# Patient Record
Sex: Female | Born: 1945 | Race: White | Hispanic: No | Marital: Married | Smoking: Never smoker
Health system: Southern US, Community
[De-identification: ages and names within clinical notes are randomized; demographics above are authoritative.]

## PROBLEM LIST (undated history)

## (undated) DIAGNOSIS — E119 Type 2 diabetes mellitus without complications: Secondary | ICD-10-CM

## (undated) DIAGNOSIS — R519 Headache, unspecified: Secondary | ICD-10-CM

## (undated) DIAGNOSIS — F329 Major depressive disorder, single episode, unspecified: Secondary | ICD-10-CM

## (undated) DIAGNOSIS — F32A Depression, unspecified: Secondary | ICD-10-CM

## (undated) DIAGNOSIS — Z87442 Personal history of urinary calculi: Secondary | ICD-10-CM

## (undated) DIAGNOSIS — F419 Anxiety disorder, unspecified: Secondary | ICD-10-CM

## (undated) DIAGNOSIS — G4730 Sleep apnea, unspecified: Secondary | ICD-10-CM

## (undated) DIAGNOSIS — I10 Essential (primary) hypertension: Secondary | ICD-10-CM

## (undated) DIAGNOSIS — N189 Chronic kidney disease, unspecified: Secondary | ICD-10-CM

## (undated) DIAGNOSIS — Z9889 Other specified postprocedural states: Secondary | ICD-10-CM

## (undated) DIAGNOSIS — M1990 Unspecified osteoarthritis, unspecified site: Secondary | ICD-10-CM

## (undated) DIAGNOSIS — Z8489 Family history of other specified conditions: Secondary | ICD-10-CM

## (undated) DIAGNOSIS — R112 Nausea with vomiting, unspecified: Secondary | ICD-10-CM

## (undated) DIAGNOSIS — G2581 Restless legs syndrome: Secondary | ICD-10-CM

## (undated) DIAGNOSIS — E785 Hyperlipidemia, unspecified: Secondary | ICD-10-CM

## (undated) DIAGNOSIS — R51 Headache: Secondary | ICD-10-CM

## (undated) DIAGNOSIS — K219 Gastro-esophageal reflux disease without esophagitis: Secondary | ICD-10-CM

## (undated) DIAGNOSIS — G4700 Insomnia, unspecified: Secondary | ICD-10-CM

## (undated) HISTORY — PX: ABDOMINAL HYSTERECTOMY: SHX81

## (undated) HISTORY — PX: BREAST SURGERY: SHX581

## (undated) HISTORY — PX: COLONOSCOPY: SHX174

## (undated) HISTORY — PX: LITHOTRIPSY: SUR834

## (undated) HISTORY — PX: BACK SURGERY: SHX140

## (undated) HISTORY — PX: TONSILLECTOMY: SUR1361

---

## 2005-05-15 ENCOUNTER — Inpatient Hospital Stay (HOSPITAL_COMMUNITY): Admission: RE | Admit: 2005-05-15 | Discharge: 2005-05-16 | Payer: Self-pay | Admitting: Neurosurgery

## 2010-07-18 ENCOUNTER — Encounter: Admission: RE | Admit: 2010-07-18 | Discharge: 2010-07-18 | Payer: Self-pay | Admitting: Neurosurgery

## 2010-08-18 ENCOUNTER — Inpatient Hospital Stay (HOSPITAL_COMMUNITY): Admission: RE | Admit: 2010-08-18 | Discharge: 2010-08-22 | Payer: Self-pay | Admitting: Neurosurgery

## 2010-10-09 ENCOUNTER — Encounter
Admission: RE | Admit: 2010-10-09 | Discharge: 2010-10-09 | Payer: Self-pay | Source: Home / Self Care | Attending: Neurosurgery | Admitting: Neurosurgery

## 2010-11-18 ENCOUNTER — Encounter
Admission: RE | Admit: 2010-11-18 | Discharge: 2010-11-18 | Payer: Self-pay | Source: Home / Self Care | Attending: Neurosurgery | Admitting: Neurosurgery

## 2010-12-31 LAB — CBC
HCT: 39.3 % (ref 36.0–46.0)
Hemoglobin: 13.2 g/dL (ref 12.0–15.0)
MCH: 30.5 pg (ref 26.0–34.0)
MCHC: 33.6 g/dL (ref 30.0–36.0)
MCV: 90.8 fL (ref 78.0–100.0)
RDW: 14.4 % (ref 11.5–15.5)

## 2010-12-31 LAB — BASIC METABOLIC PANEL
BUN: 12 mg/dL (ref 6–23)
CO2: 29 mEq/L (ref 19–32)
Chloride: 103 mEq/L (ref 96–112)
Glucose, Bld: 112 mg/dL — ABNORMAL HIGH (ref 70–99)
Potassium: 4.2 mEq/L (ref 3.5–5.1)
Sodium: 141 mEq/L (ref 135–145)

## 2010-12-31 LAB — ABO/RH: ABO/RH(D): A POS

## 2010-12-31 LAB — TYPE AND SCREEN

## 2010-12-31 LAB — SURGICAL PCR SCREEN: MRSA, PCR: NEGATIVE

## 2011-01-15 ENCOUNTER — Other Ambulatory Visit: Payer: Self-pay | Admitting: Neurosurgery

## 2011-01-15 ENCOUNTER — Ambulatory Visit
Admission: RE | Admit: 2011-01-15 | Discharge: 2011-01-15 | Disposition: A | Discharge status: Home / Self Care | Payer: 59 | Source: Ambulatory Visit | Attending: Neurosurgery | Admitting: Neurosurgery

## 2011-01-15 DIAGNOSIS — M545 Low back pain: Secondary | ICD-10-CM

## 2011-01-15 DIAGNOSIS — M549 Dorsalgia, unspecified: Secondary | ICD-10-CM

## 2011-02-17 ENCOUNTER — Ambulatory Visit
Admission: RE | Admit: 2011-02-17 | Discharge: 2011-02-17 | Disposition: A | Discharge status: Home / Self Care | Payer: 59 | Source: Ambulatory Visit | Attending: Neurosurgery | Admitting: Neurosurgery

## 2011-02-17 DIAGNOSIS — M545 Low back pain: Secondary | ICD-10-CM

## 2011-03-06 NOTE — Op Note (Signed)
Tammy Hicks, Tammy Hicks                  ACCOUNT NO.:  [id number]   MEDICAL RECORD NO.:  [id number]          PATIENT TYPE:  INP   LOCATION:  3013                         FACILITY:  [facility]   PHYSICIAN:  Donalee Citrin, M.D.        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   PROCEDURE:  Decompressive laminectomies L3-4 and L4-5 with microscopic  dissection of the L3-4, and 5 nerve roots.   SURGEON:  Donalee Citrin, M.D.   ASSISTANT:  Tia Alert, MD.   ANESTHESIA:  General endotracheal.   HISTORY OF PRESENT ILLNESS:  The patient is a very pleasant 65 year old  female who has had longstanding back and right leg pain radiating down to  the lateral aspect of her thigh to her knee, anterior shin, as well as big  toe consistent with 4 and 5 root distribution, possible 3.  The patient  failed all forms of conservative treatment and was recommended laminectomy  and decompression of roots.  The risks and benefits of the surgery were  discussed with the patient, she understood, and agreed to proceed forth.   DESCRIPTION OF PROCEDURE:  The patient was brought to the operating room,  was induced under general anesthesia, positioned prone on the Wilson frame.  The back was prepped and draped in the usual sterile fashion.  After a preop  x-ray localized the L4 spinous process, after infiltration of 10 mL of  lidocaine with epinephrine, a midline incision was made and Bovie  electrocautery was used to take down the subcutaneous tissue.  Subperiosteal  dissection was carried out at the lamina of L3,4, and 5 bilaterally.  Intraoperative x-ray confirmed localization of 4-5 disk space and medial  aspect of the facet complex and superior aspect of L3 and L3-4.  __________L4 and  medial aspect of facet complex of L4-5 was drilled down.  Then using a 3-mm Kerrison punch in the inferior aspect of both, the 3 and 4  lamina were removed and the medial facet complex.  The L4 lamina was almost  entirely removed as well as the L3 exposing the ligamentum of flavum which  was noted to be markedly hypertrophied. This was teased away with thecal sac  with 4 Penfield and removed in piecemeal fashion with Kerrison punch.  Then,  the operating microscope was draped and brought onto the field.  Under  microscopic illumination, the severely hypertrophied facet complex which was  causing hourglass compression of the thecal sac was then teased away from  the dura and under bit with a 2 and 3-mm Kerrison punch decompressing the  lateral canal.  The proximal L4  nerve root was identified in 3,4  decompressive site, and the L4 pedicle was identified.  The L4 nerve root  was then tracked inferiorly and appeared to be a conjoined nerve root coming  off  at the level of the disk space in the 4-5 disk space.  This was noted to  be severely stenotic from the lateral facet complex at L4-5 as well as the  superior aspect of that facet.  This was all under bit with a 2-mm Kerrison  punch decompressing the 4 foramen, and a nerve hook, coronary dilator, and  angled hockey stick were passed from the 3-4 decompressive site down to the  4-5 decompressive site, and it was easily visualized and easily passed down  the 3 foramen, 4 foramen, and 5 foramen.  At the end of the decompression,  there was no further stenosis.  The wound was copiously irrigated and  meticulous hemostasis was maintained.  Gelfoam was overlaid on top of the  dura.  The muscle and fascia were reapproximated with 0  interrupted Vicryl, and the skin was closed with running 4-0 subcuticular.  Benzoin and Steri-Strips were applied.  The patient went to recovery in  stable condition.  At the end of the case, needle counts and sponge  counts  were correct.       GC/MEDQ  D:  05/15/2005  T:  05/15/2005  Job:  [id number]

## 2011-08-18 ENCOUNTER — Ambulatory Visit
Admission: RE | Admit: 2011-08-18 | Discharge: 2011-08-18 | Disposition: A | Discharge status: Home / Self Care | Payer: 59 | Source: Ambulatory Visit | Attending: Neurosurgery | Admitting: Neurosurgery

## 2011-08-18 ENCOUNTER — Other Ambulatory Visit: Payer: Self-pay | Admitting: Neurosurgery

## 2011-08-18 DIAGNOSIS — M545 Low back pain, unspecified: Secondary | ICD-10-CM

## 2011-09-01 ENCOUNTER — Ambulatory Visit
Admission: RE | Admit: 2011-09-01 | Discharge: 2011-09-01 | Disposition: A | Discharge status: Home / Self Care | Payer: 59 | Source: Ambulatory Visit | Attending: Neurosurgery | Admitting: Neurosurgery

## 2011-09-01 DIAGNOSIS — M545 Low back pain: Secondary | ICD-10-CM

## 2013-07-31 DIAGNOSIS — G2581 Restless legs syndrome: Secondary | ICD-10-CM | POA: Insufficient documentation

## 2013-07-31 DIAGNOSIS — E785 Hyperlipidemia, unspecified: Secondary | ICD-10-CM | POA: Insufficient documentation

## 2013-11-09 DIAGNOSIS — I10 Essential (primary) hypertension: Secondary | ICD-10-CM | POA: Insufficient documentation

## 2014-01-31 DIAGNOSIS — F411 Generalized anxiety disorder: Secondary | ICD-10-CM | POA: Insufficient documentation

## 2014-02-21 DIAGNOSIS — H8110 Benign paroxysmal vertigo, unspecified ear: Secondary | ICD-10-CM | POA: Insufficient documentation

## 2014-02-21 DIAGNOSIS — I8393 Asymptomatic varicose veins of bilateral lower extremities: Secondary | ICD-10-CM | POA: Insufficient documentation

## 2014-04-20 DIAGNOSIS — E119 Type 2 diabetes mellitus without complications: Secondary | ICD-10-CM | POA: Insufficient documentation

## 2014-05-28 DIAGNOSIS — K219 Gastro-esophageal reflux disease without esophagitis: Secondary | ICD-10-CM | POA: Insufficient documentation

## 2014-07-19 DIAGNOSIS — R319 Hematuria, unspecified: Secondary | ICD-10-CM | POA: Insufficient documentation

## 2014-07-19 DIAGNOSIS — M48061 Spinal stenosis, lumbar region without neurogenic claudication: Secondary | ICD-10-CM | POA: Insufficient documentation

## 2014-10-30 DIAGNOSIS — M542 Cervicalgia: Secondary | ICD-10-CM | POA: Insufficient documentation

## 2014-12-11 DIAGNOSIS — M533 Sacrococcygeal disorders, not elsewhere classified: Secondary | ICD-10-CM | POA: Insufficient documentation

## 2015-08-05 DIAGNOSIS — M5416 Radiculopathy, lumbar region: Secondary | ICD-10-CM | POA: Insufficient documentation

## 2015-08-22 DIAGNOSIS — M5459 Other low back pain: Secondary | ICD-10-CM | POA: Insufficient documentation

## 2015-09-03 DIAGNOSIS — F330 Major depressive disorder, recurrent, mild: Secondary | ICD-10-CM | POA: Insufficient documentation

## 2015-10-16 DIAGNOSIS — Z9181 History of falling: Secondary | ICD-10-CM | POA: Insufficient documentation

## 2015-12-04 DIAGNOSIS — E669 Obesity, unspecified: Secondary | ICD-10-CM | POA: Insufficient documentation

## 2016-03-10 DIAGNOSIS — M461 Sacroiliitis, not elsewhere classified: Secondary | ICD-10-CM | POA: Insufficient documentation

## 2016-05-25 DIAGNOSIS — F451 Undifferentiated somatoform disorder: Secondary | ICD-10-CM | POA: Insufficient documentation

## 2016-05-29 DIAGNOSIS — M5136 Other intervertebral disc degeneration, lumbar region: Secondary | ICD-10-CM | POA: Insufficient documentation

## 2016-05-29 DIAGNOSIS — M51369 Other intervertebral disc degeneration, lumbar region without mention of lumbar back pain or lower extremity pain: Secondary | ICD-10-CM | POA: Insufficient documentation

## 2017-01-29 DIAGNOSIS — M7989 Other specified soft tissue disorders: Secondary | ICD-10-CM | POA: Insufficient documentation

## 2017-03-20 DIAGNOSIS — I872 Venous insufficiency (chronic) (peripheral): Secondary | ICD-10-CM | POA: Insufficient documentation

## 2017-05-18 DIAGNOSIS — I83891 Varicose veins of right lower extremities with other complications: Secondary | ICD-10-CM | POA: Insufficient documentation

## 2017-05-31 ENCOUNTER — Other Ambulatory Visit: Payer: Self-pay | Admitting: Neurosurgery

## 2017-06-11 NOTE — Pre-Procedure Instructions (Addendum)
[facility] [place]  06/11/2017      [facility] [address], [place] - [address] [facility] [place] [address] Phone: [phone] Fax: [phone]    Your procedure is scheduled on Thursday September 6.  Report to [facility] Admitting at 7:45 A.M.  Call this number if you have problems the morning of surgery:  [phone]   Remember:  Do not eat food or drink liquids after midnight.  Continue all medications as prescribed, except follow the instructions below:   Take these medicines the morning of surgery with A SIP OF WATER: clorazepate (Tranxene), desvenlafazine (Pristiq), estradiol (estrace), gabapentin (neurontin), meclizine (antivert) if needed, vilazodone (Viibryd)  7 days prior to surgery STOP taking any Aspirin, FIORICET, Aleve, Naproxen, Ibuprofen, Motrin, Advil, Goody's, BC's, all herbal medications, fish oil, and all vitamins   WHAT DO I DO ABOUT MY DIABETES MEDICATION?   Do not take oral diabetes medicines (pills) the morning of surgery. DO NOT TAKE glipizide (glucotrol), sitagliptin (Januvia) the day of surgery  TAKE ONLY MORNING and/or lunch doses of Glipizide (glucotrol) the day before surgery    How to Manage Your Diabetes Before and After Surgery  Why is it important to control my blood sugar before and after surgery? . Improving blood sugar levels before and after surgery helps healing and can limit problems. . A way of improving blood sugar control is eating a healthy diet by: o  Eating less sugar and carbohydrates o  Increasing activity/exercise o  Talking with your doctor about reaching your blood sugar goals . High blood sugars (greater than 180 mg/dL) can raise your risk of infections and slow your recovery, so you will need to focus on controlling your diabetes during the weeks before surgery. . Make sure that the doctor who takes care of your diabetes knows about your planned surgery including  the date and location.  How do I manage my blood sugar before surgery? . Check your blood sugar at least 4 times a day, starting 2 days before surgery, to make sure that the level is not too high or low. o Check your blood sugar the morning of your surgery when you wake up and every 2 hours until you get to the Short Stay unit. . If your blood sugar is less than 70 mg/dL, you will need to treat for low blood sugar: o Do not take insulin. o Treat a low blood sugar (less than 70 mg/dL) with  cup of clear juice (cranberry or apple), 4 glucose tablets, OR glucose gel. o Recheck blood sugar in 15 minutes after treatment (to make sure it is greater than 70 mg/dL). If your blood sugar is not greater than 70 mg/dL on recheck, call [phone] for further instructions. . Report your blood sugar to the short stay nurse when you get to Short Stay.  . If you are admitted to the hospital after surgery: o Your blood sugar will be checked by the staff and you will probably be given insulin after surgery (instead of oral diabetes medicines) to make sure you have good blood sugar levels. o The goal for blood sugar control after surgery is 80-180 mg/dL.              Do not wear jewelry, make-up or nail polish.  Do not wear lotions, powders, or perfumes, or deoderant.  Do not shave 48 hours prior to surgery.  Men may shave face and neck.  Do not bring  valuables to the hospital.  [facility] is not responsible for any belongings or valuables.  Contacts, dentures or bridgework may not be worn into surgery.  Leave your suitcase in the car.  After surgery it may be brought to your room.  For patients admitted to the hospital, discharge time will be determined by your treatment team.  Patients discharged the day of surgery will not be allowed to drive home.  Special instructions:    [facility]- Preparing For Surgery  Before surgery, you can play an important role. Because skin is not sterile,  your skin needs to be as free of germs as possible. You can reduce the number of germs on your skin by washing with CHG (chlorahexidine gluconate) Soap before surgery.  CHG is an antiseptic cleaner which kills germs and bonds with the skin to continue killing germs even after washing.  Please do not use if you have an allergy to CHG or antibacterial soaps. If your skin becomes reddened/irritated stop using the CHG.  Do not shave (including legs and underarms) for at least 48 hours prior to first CHG shower. It is OK to shave your face.  Please follow these instructions carefully.   1. Shower the NIGHT BEFORE SURGERY and the MORNING OF SURGERY with CHG.   2. If you chose to wash your hair, wash your hair first as usual with your normal shampoo.  3. After you shampoo, rinse your hair and body thoroughly to remove the shampoo.  4. Use CHG as you would any other liquid soap. You can apply CHG directly to the skin and wash gently with a scrungie or a clean washcloth.   5. Apply the CHG Soap to your body ONLY FROM THE NECK DOWN.  Do not use on open wounds or open sores. Avoid contact with your eyes, ears, mouth and genitals (private parts). Wash genitals (private parts) with your normal soap.  6. Wash thoroughly, paying special attention to the area where your surgery will be performed.  7. Thoroughly rinse your body with warm water from the neck down.  8. DO NOT shower/wash with your normal soap after using and rinsing off the CHG Soap.  9. Pat yourself dry with a CLEAN TOWEL.   10. Wear CLEAN PAJAMAS   11. Place CLEAN SHEETS on your bed the night of your first shower and DO NOT SLEEP WITH PETS.    Day of Surgery: Do not apply any deodorants/lotions. Please wear clean clothes to the hospital/surgery center.      Please read over the following fact sheets that you were given. MRSA Information

## 2017-06-14 ENCOUNTER — Encounter (HOSPITAL_COMMUNITY): Payer: Self-pay

## 2017-06-14 ENCOUNTER — Encounter (HOSPITAL_COMMUNITY)
Admission: RE | Admit: 2017-06-14 | Discharge: 2017-06-14 | Disposition: A | Discharge status: Home / Self Care | Payer: Medicare Other | Source: Ambulatory Visit | Attending: Neurosurgery | Admitting: Neurosurgery

## 2017-06-14 DIAGNOSIS — M5440 Lumbago with sciatica, unspecified side: Secondary | ICD-10-CM | POA: Insufficient documentation

## 2017-06-14 DIAGNOSIS — Z01812 Encounter for preprocedural laboratory examination: Secondary | ICD-10-CM | POA: Insufficient documentation

## 2017-06-14 DIAGNOSIS — Z0183 Encounter for blood typing: Secondary | ICD-10-CM | POA: Insufficient documentation

## 2017-06-14 DIAGNOSIS — I491 Atrial premature depolarization: Secondary | ICD-10-CM | POA: Insufficient documentation

## 2017-06-14 DIAGNOSIS — Z01818 Encounter for other preprocedural examination: Secondary | ICD-10-CM | POA: Diagnosis not present

## 2017-06-14 HISTORY — DX: Restless legs syndrome: G25.81

## 2017-06-14 HISTORY — DX: Headache: R51

## 2017-06-14 HISTORY — DX: Type 2 diabetes mellitus without complications: E11.9

## 2017-06-14 HISTORY — DX: Personal history of urinary calculi: Z87.442

## 2017-06-14 HISTORY — DX: Hyperlipidemia, unspecified: E78.5

## 2017-06-14 HISTORY — DX: Family history of other specified conditions: Z84.89

## 2017-06-14 HISTORY — DX: Sleep apnea, unspecified: G47.30

## 2017-06-14 HISTORY — DX: Gastro-esophageal reflux disease without esophagitis: K21.9

## 2017-06-14 HISTORY — DX: Headache, unspecified: R51.9

## 2017-06-14 HISTORY — DX: Insomnia, unspecified: G47.00

## 2017-06-14 HISTORY — DX: Anxiety disorder, unspecified: F41.9

## 2017-06-14 HISTORY — DX: Essential (primary) hypertension: I10

## 2017-06-14 HISTORY — DX: Depression, unspecified: F32.A

## 2017-06-14 HISTORY — DX: Major depressive disorder, single episode, unspecified: F32.9

## 2017-06-14 HISTORY — DX: Unspecified osteoarthritis, unspecified site: M19.90

## 2017-06-14 LAB — CBC
HEMATOCRIT: 45.2 % (ref 36.0–46.0)
HEMOGLOBIN: 14.7 g/dL (ref 12.0–15.0)
MCH: 29.4 pg (ref 26.0–34.0)
MCHC: 32.5 g/dL (ref 30.0–36.0)
MCV: 90.4 fL (ref 78.0–100.0)
Platelets: 291 10*3/uL (ref 150–400)
RBC: 5 MIL/uL (ref 3.87–5.11)
RDW: 14 % (ref 11.5–15.5)
WBC: 11.9 10*3/uL — ABNORMAL HIGH (ref 4.0–10.5)

## 2017-06-14 LAB — BASIC METABOLIC PANEL
ANION GAP: 12 (ref 5–15)
BUN: 10 mg/dL (ref 6–20)
CHLORIDE: 104 mmol/L (ref 101–111)
CO2: 26 mmol/L (ref 22–32)
Calcium: 9.9 mg/dL (ref 8.9–10.3)
Creatinine, Ser: 0.73 mg/dL (ref 0.44–1.00)
GFR calc Af Amer: >60 mL/min (ref >60)
GLUCOSE: 147 mg/dL — AB (ref 65–99)
POTASSIUM: 3.4 mmol/L — AB (ref 3.5–5.1)
Sodium: 142 mmol/L (ref 135–145)

## 2017-06-14 LAB — TYPE AND SCREEN
ABO/RH(D): A POS
Antibody Screen: NEGATIVE

## 2017-06-14 LAB — GLUCOSE, CAPILLARY: Glucose-Capillary: 158 mg/dL — ABNORMAL HIGH (ref 65–99)

## 2017-06-14 LAB — HEMOGLOBIN A1C
Hgb A1c MFr Bld: 6.3 % — ABNORMAL HIGH (ref 4.8–5.6)
MEAN PLASMA GLUCOSE: 134.11 mg/dL

## 2017-06-14 LAB — SURGICAL PCR SCREEN
MRSA, PCR: NEGATIVE
Staphylococcus aureus: NEGATIVE

## 2017-06-14 MED ORDER — CHLORHEXIDINE GLUCONATE CLOTH 2 % EX PADS: 6.0000 | MEDICATED_PAD | Freq: Once | CUTANEOUS | Status: DC

## 2017-06-14 NOTE — Progress Notes (Signed)
PCP - Doreatha Martin Cardiologist - none  EKG - 06/14/2017  Stress Test - pt reports normal stress test over 10 years ago, unsure who performed this test Sleep Study - "10 years ago" with Dr. Jerolyn Shin, records requested CPAP - uses CPAP, does not know pressure settings  Fasting Blood Sugar - pt reports having CBG of 68 this AM when she woke up, patient ate bowl of cereal, CBG 158 at PAT appointment. Pt educated to let PCP know if CBG consistently low in the AM as she takes Glipizide at night.   Pt reports that she was started on a new BP medicine (Benicar), and ever since she was taking this medicine she has been forgetful. Pt states she is going to call her PCP after appointment today to notify of this issue with her BP medicine.   Patient denies shortness of breath, fever, cough and chest pain at PAT appointment  Patient verbalized understanding of instructions that were given to them at the PAT appointment. Patient was also instructed that they will need to review over the PAT instructions again at home before surgery.

## 2017-06-23 ENCOUNTER — Encounter (HOSPITAL_COMMUNITY): Payer: Self-pay | Admitting: General Practice

## 2017-06-23 MED ORDER — CEFAZOLIN SODIUM-DEXTROSE 2-4 GM/100ML-% IV SOLN
2.0000 g | INTRAVENOUS | Status: AC
  Administered 2017-06-24: 2 g via INTRAVENOUS
  Filled 2017-06-23: qty 100

## 2017-06-24 ENCOUNTER — Inpatient Hospital Stay (HOSPITAL_COMMUNITY)
Admission: RE | Admit: 2017-06-24 | Discharge: 2017-06-25 | DRG: 455 | Disposition: A | Discharge status: Home / Self Care | Payer: Medicare Other | Source: Ambulatory Visit | Attending: Neurosurgery | Admitting: Neurosurgery

## 2017-06-24 ENCOUNTER — Inpatient Hospital Stay (HOSPITAL_COMMUNITY): Payer: Medicare Other | Admitting: Certified Registered Nurse Anesthetist

## 2017-06-24 ENCOUNTER — Inpatient Hospital Stay (HOSPITAL_COMMUNITY): Payer: Medicare Other

## 2017-06-24 ENCOUNTER — Encounter (HOSPITAL_COMMUNITY): Payer: Self-pay | Admitting: Certified Registered Nurse Anesthetist

## 2017-06-24 ENCOUNTER — Encounter (HOSPITAL_COMMUNITY)
Admission: RE | Disposition: A | Discharge status: Home / Self Care | Payer: Self-pay | Source: Ambulatory Visit | Attending: Neurosurgery

## 2017-06-24 DIAGNOSIS — Z7982 Long term (current) use of aspirin: Secondary | ICD-10-CM

## 2017-06-24 DIAGNOSIS — G43909 Migraine, unspecified, not intractable, without status migrainosus: Secondary | ICD-10-CM | POA: Diagnosis present

## 2017-06-24 DIAGNOSIS — Z791 Long term (current) use of non-steroidal anti-inflammatories (NSAID): Secondary | ICD-10-CM | POA: Diagnosis not present

## 2017-06-24 DIAGNOSIS — R402142 Coma scale, eyes open, spontaneous, at arrival to emergency department: Secondary | ICD-10-CM | POA: Diagnosis present

## 2017-06-24 DIAGNOSIS — I10 Essential (primary) hypertension: Secondary | ICD-10-CM | POA: Diagnosis present

## 2017-06-24 DIAGNOSIS — Z7951 Long term (current) use of inhaled steroids: Secondary | ICD-10-CM

## 2017-06-24 DIAGNOSIS — G4700 Insomnia, unspecified: Secondary | ICD-10-CM | POA: Diagnosis present

## 2017-06-24 DIAGNOSIS — R402362 Coma scale, best motor response, obeys commands, at arrival to emergency department: Secondary | ICD-10-CM | POA: Diagnosis present

## 2017-06-24 DIAGNOSIS — K219 Gastro-esophageal reflux disease without esophagitis: Secondary | ICD-10-CM | POA: Diagnosis present

## 2017-06-24 DIAGNOSIS — E785 Hyperlipidemia, unspecified: Secondary | ICD-10-CM | POA: Diagnosis present

## 2017-06-24 DIAGNOSIS — M48061 Spinal stenosis, lumbar region without neurogenic claudication: Principal | ICD-10-CM | POA: Diagnosis present

## 2017-06-24 DIAGNOSIS — M549 Dorsalgia, unspecified: Secondary | ICD-10-CM | POA: Diagnosis present

## 2017-06-24 DIAGNOSIS — Z90710 Acquired absence of both cervix and uterus: Secondary | ICD-10-CM | POA: Diagnosis not present

## 2017-06-24 DIAGNOSIS — G2581 Restless legs syndrome: Secondary | ICD-10-CM | POA: Diagnosis present

## 2017-06-24 DIAGNOSIS — Z888 Allergy status to other drugs, medicaments and biological substances status: Secondary | ICD-10-CM | POA: Diagnosis not present

## 2017-06-24 DIAGNOSIS — R402252 Coma scale, best verbal response, oriented, at arrival to emergency department: Secondary | ICD-10-CM | POA: Diagnosis present

## 2017-06-24 DIAGNOSIS — M1990 Unspecified osteoarthritis, unspecified site: Secondary | ICD-10-CM | POA: Diagnosis present

## 2017-06-24 DIAGNOSIS — E119 Type 2 diabetes mellitus without complications: Secondary | ICD-10-CM | POA: Diagnosis present

## 2017-06-24 DIAGNOSIS — Z419 Encounter for procedure for purposes other than remedying health state, unspecified: Secondary | ICD-10-CM

## 2017-06-24 DIAGNOSIS — G4730 Sleep apnea, unspecified: Secondary | ICD-10-CM | POA: Diagnosis present

## 2017-06-24 DIAGNOSIS — Z885 Allergy status to narcotic agent status: Secondary | ICD-10-CM | POA: Diagnosis not present

## 2017-06-24 HISTORY — DX: Other specified postprocedural states: Z98.890

## 2017-06-24 HISTORY — DX: Nausea with vomiting, unspecified: R11.2

## 2017-06-24 LAB — GLUCOSE, CAPILLARY
GLUCOSE-CAPILLARY: 107 mg/dL — AB (ref 65–99)
GLUCOSE-CAPILLARY: 181 mg/dL — AB (ref 65–99)
GLUCOSE-CAPILLARY: 254 mg/dL — AB (ref 65–99)
GLUCOSE-CAPILLARY: 263 mg/dL — AB (ref 65–99)
Glucose-Capillary: 107 mg/dL — ABNORMAL HIGH (ref 65–99)

## 2017-06-24 SURGERY — POSTERIOR LUMBAR FUSION 1 LEVEL
Anesthesia: General | Site: Spine Lumbar

## 2017-06-24 MED ORDER — PROMETHAZINE HCL 25 MG/ML IJ SOLN: 6.2500 mg | INTRAMUSCULAR | Status: DC | PRN

## 2017-06-24 MED ORDER — LACTATED RINGERS IV SOLN
INTRAVENOUS | Status: DC
  Administered 2017-06-24 (×3): via INTRAVENOUS

## 2017-06-24 MED ORDER — HYDROMORPHONE HCL 1 MG/ML IJ SOLN
INTRAMUSCULAR | Status: AC
  Administered 2017-06-24: 0.5 mg via INTRAVENOUS
  Filled 2017-06-24: qty 1

## 2017-06-24 MED ORDER — ACETAMINOPHEN 650 MG RE SUPP
650.0000 mg | RECTAL | Status: DC | PRN
Start: 2017-06-24 — End: 2017-06-25

## 2017-06-24 MED ORDER — BUTALBITAL-APAP-CAFFEINE 50-325-40 MG PO TABS: 1.0000 | ORAL_TABLET | ORAL | Status: DC | PRN

## 2017-06-24 MED ORDER — LIDOCAINE-EPINEPHRINE 1 %-1:100000 IJ SOLN
INTRAMUSCULAR | Status: AC
  Filled 2017-06-24: qty 1

## 2017-06-24 MED ORDER — PANTOPRAZOLE SODIUM 40 MG PO TBEC: 40.0000 mg | DELAYED_RELEASE_TABLET | Freq: Every day | ORAL | Status: DC

## 2017-06-24 MED ORDER — 0.9 % SODIUM CHLORIDE (POUR BTL) OPTIME
TOPICAL | Status: DC | PRN
  Administered 2017-06-24: 1000 mL

## 2017-06-24 MED ORDER — HYDROMORPHONE HCL 1 MG/ML IJ SOLN
0.2500 mg | INTRAMUSCULAR | Status: DC | PRN
Start: 2017-06-24 — End: 2017-06-24
  Administered 2017-06-24 (×4): 0.5 mg via INTRAVENOUS

## 2017-06-24 MED ORDER — SODIUM CHLORIDE 0.9% FLUSH: 3.0000 mL | INTRAVENOUS | Status: DC | PRN

## 2017-06-24 MED ORDER — POTASSIUM CHLORIDE CRYS ER 20 MEQ PO TBCR
20.0000 meq | EXTENDED_RELEASE_TABLET | Freq: Two times a day (BID) | ORAL | Status: DC
  Administered 2017-06-24 – 2017-06-25 (×2): 20 meq via ORAL
  Filled 2017-06-24 (×2): qty 1

## 2017-06-24 MED ORDER — PANTOPRAZOLE SODIUM 40 MG IV SOLR
40.0000 mg | Freq: Every day | INTRAVENOUS | Status: DC
  Administered 2017-06-24: 40 mg via INTRAVENOUS
  Filled 2017-06-24: qty 40

## 2017-06-24 MED ORDER — SURGIFOAM 100 EX MISC
CUTANEOUS | Status: DC | PRN
  Administered 2017-06-24 (×2): 20 mL via TOPICAL

## 2017-06-24 MED ORDER — CYCLOBENZAPRINE HCL 10 MG PO TABS
10.0000 mg | ORAL_TABLET | Freq: Three times a day (TID) | ORAL | Status: DC | PRN
  Administered 2017-06-24 – 2017-06-25 (×2): 10 mg via ORAL
  Filled 2017-06-24 (×2): qty 1

## 2017-06-24 MED ORDER — ONDANSETRON HCL 4 MG PO TABS: 4.0000 mg | ORAL_TABLET | Freq: Four times a day (QID) | ORAL | Status: DC | PRN

## 2017-06-24 MED ORDER — BUPIVACAINE LIPOSOME 1.3 % IJ SUSP
INTRAMUSCULAR | Status: DC | PRN
  Administered 2017-06-24: 20 mL

## 2017-06-24 MED ORDER — SODIUM CHLORIDE 0.9 % IV SOLN: 250.0000 mL | INTRAVENOUS | Status: DC

## 2017-06-24 MED ORDER — FUROSEMIDE 40 MG PO TABS: 40.0000 mg | ORAL_TABLET | Freq: Every day | ORAL | Status: DC | PRN

## 2017-06-24 MED ORDER — POLYVINYL ALCOHOL 1.4 % OP SOLN
1.0000 [drp] | Freq: Every evening | OPHTHALMIC | Status: DC | PRN
  Filled 2017-06-24: qty 15

## 2017-06-24 MED ORDER — MEPERIDINE HCL 25 MG/ML IJ SOLN: 6.2500 mg | INTRAMUSCULAR | Status: DC | PRN

## 2017-06-24 MED ORDER — LIDOCAINE HCL (CARDIAC) 20 MG/ML IV SOLN
INTRAVENOUS | Status: DC | PRN
  Administered 2017-06-24: 20 mg via INTRAVENOUS

## 2017-06-24 MED ORDER — SODIUM CHLORIDE 0.9 % IR SOLN
Status: DC | PRN
  Administered 2017-06-24: 500 mL

## 2017-06-24 MED ORDER — VANCOMYCIN HCL 1000 MG IV SOLR
INTRAVENOUS | Status: AC
  Filled 2017-06-24: qty 1000

## 2017-06-24 MED ORDER — LIDOCAINE-EPINEPHRINE 1 %-1:100000 IJ SOLN
INTRAMUSCULAR | Status: DC | PRN
  Administered 2017-06-24: 9 mL

## 2017-06-24 MED ORDER — SCOPOLAMINE 1 MG/3DAYS TD PT72
MEDICATED_PATCH | TRANSDERMAL | Status: AC
  Administered 2017-06-24: 1.5 mg
  Filled 2017-06-24: qty 1

## 2017-06-24 MED ORDER — ALUM & MAG HYDROXIDE-SIMETH 200-200-20 MG/5ML PO SUSP: 30.0000 mL | Freq: Four times a day (QID) | ORAL | Status: DC | PRN

## 2017-06-24 MED ORDER — PRAVASTATIN SODIUM 10 MG PO TABS
10.0000 mg | ORAL_TABLET | Freq: Every day | ORAL | Status: DC
  Filled 2017-06-24 (×2): qty 1

## 2017-06-24 MED ORDER — FENTANYL CITRATE (PF) 100 MCG/2ML IJ SOLN
INTRAMUSCULAR | Status: DC | PRN
  Administered 2017-06-24: 50 ug via INTRAVENOUS
  Administered 2017-06-24: 100 ug via INTRAVENOUS
  Administered 2017-06-24: 50 ug via INTRAVENOUS
  Administered 2017-06-24: 150 ug via INTRAVENOUS
  Administered 2017-06-24: 100 ug via INTRAVENOUS

## 2017-06-24 MED ORDER — FENTANYL CITRATE (PF) 250 MCG/5ML IJ SOLN
INTRAMUSCULAR | Status: AC
  Filled 2017-06-24: qty 5

## 2017-06-24 MED ORDER — VANCOMYCIN HCL 1000 MG IV SOLR
INTRAVENOUS | Status: DC | PRN
  Administered 2017-06-24: 1000 mg via TOPICAL

## 2017-06-24 MED ORDER — MIDAZOLAM HCL 2 MG/2ML IJ SOLN: 0.5000 mg | Freq: Once | INTRAMUSCULAR | Status: DC | PRN

## 2017-06-24 MED ORDER — VENLAFAXINE HCL ER 37.5 MG PO CP24
37.5000 mg | ORAL_CAPSULE | Freq: Every day | ORAL | Status: DC
  Administered 2017-06-25: 37.5 mg via ORAL
  Filled 2017-06-24: qty 1

## 2017-06-24 MED ORDER — GLIPIZIDE 5 MG PO TABS
5.0000 mg | ORAL_TABLET | Freq: Two times a day (BID) | ORAL | Status: DC
  Administered 2017-06-24 – 2017-06-25 (×2): 5 mg via ORAL
  Filled 2017-06-24 (×4): qty 1

## 2017-06-24 MED ORDER — VILAZODONE HCL 20 MG PO TABS
20.0000 mg | ORAL_TABLET | Freq: Every day | ORAL | Status: DC
  Administered 2017-06-25: 20 mg via ORAL
  Filled 2017-06-24 (×2): qty 1

## 2017-06-24 MED ORDER — THROMBIN 20000 UNITS EX SOLR
CUTANEOUS | Status: AC
  Filled 2017-06-24: qty 20000

## 2017-06-24 MED ORDER — FENTANYL CITRATE (PF) 250 MCG/5ML IJ SOLN
INTRAMUSCULAR | Status: AC
Start: 2017-06-24 — End: ?
  Filled 2017-06-24: qty 5

## 2017-06-24 MED ORDER — SODIUM CHLORIDE 0.9% FLUSH
3.0000 mL | Freq: Two times a day (BID) | INTRAVENOUS | Status: DC
  Administered 2017-06-25: 3 mL via INTRAVENOUS

## 2017-06-24 MED ORDER — ONDANSETRON HCL 4 MG/2ML IJ SOLN
INTRAMUSCULAR | Status: DC | PRN
  Administered 2017-06-24 (×2): 4 mg via INTRAVENOUS

## 2017-06-24 MED ORDER — OXYCODONE-ACETAMINOPHEN 5-325 MG PO TABS
1.0000 | ORAL_TABLET | ORAL | Status: DC | PRN
  Administered 2017-06-24 – 2017-06-25 (×2): 1 via ORAL
  Filled 2017-06-24 (×2): qty 1

## 2017-06-24 MED ORDER — HYDROMORPHONE HCL 1 MG/ML IJ SOLN
0.5000 mg | INTRAMUSCULAR | Status: DC | PRN
  Administered 2017-06-24: 0.5 mg via INTRAVENOUS
  Filled 2017-06-24: qty 0.5

## 2017-06-24 MED ORDER — ZOLPIDEM TARTRATE 5 MG PO TABS: 5.0000 mg | ORAL_TABLET | Freq: Once | ORAL | Status: DC

## 2017-06-24 MED ORDER — OXYCODONE-ACETAMINOPHEN 10-325 MG PO TABS: 1.0000 | ORAL_TABLET | ORAL | Status: DC | PRN

## 2017-06-24 MED ORDER — MECLIZINE HCL 12.5 MG PO TABS: 25.0000 mg | ORAL_TABLET | Freq: Four times a day (QID) | ORAL | Status: DC | PRN

## 2017-06-24 MED ORDER — CEFAZOLIN SODIUM-DEXTROSE 2-4 GM/100ML-% IV SOLN
INTRAVENOUS | Status: AC
  Filled 2017-06-24: qty 100

## 2017-06-24 MED ORDER — SUGAMMADEX SODIUM 200 MG/2ML IV SOLN
INTRAVENOUS | Status: DC | PRN
  Administered 2017-06-24: 200 mg via INTRAVENOUS

## 2017-06-24 MED ORDER — OLMESARTAN MEDOXOMIL-HCTZ 40-25 MG PO TABS: 1.0000 | ORAL_TABLET | Freq: Every day | ORAL | Status: DC

## 2017-06-24 MED ORDER — FLUTICASONE PROPIONATE 50 MCG/ACT NA SUSP: 1.0000 | Freq: Every evening | NASAL | Status: DC | PRN

## 2017-06-24 MED ORDER — OXYCODONE HCL 5 MG PO TABS
5.0000 mg | ORAL_TABLET | ORAL | Status: DC | PRN
  Administered 2017-06-24 – 2017-06-25 (×2): 5 mg via ORAL
  Filled 2017-06-24 (×2): qty 1

## 2017-06-24 MED ORDER — PROPOFOL 10 MG/ML IV BOLUS
INTRAVENOUS | Status: AC
  Filled 2017-06-24: qty 20

## 2017-06-24 MED ORDER — CEFAZOLIN SODIUM-DEXTROSE 2-4 GM/100ML-% IV SOLN
2.0000 g | Freq: Three times a day (TID) | INTRAVENOUS | Status: DC
  Administered 2017-06-24 – 2017-06-25 (×3): 2 g via INTRAVENOUS
  Filled 2017-06-24 (×3): qty 100

## 2017-06-24 MED ORDER — MENTHOL 3 MG MT LOZG: 1.0000 | LOZENGE | OROMUCOSAL | Status: DC | PRN

## 2017-06-24 MED ORDER — PRAMIPEXOLE DIHYDROCHLORIDE 1 MG PO TABS
1.0000 mg | ORAL_TABLET | Freq: Every day | ORAL | Status: DC
  Administered 2017-06-24: 1 mg via ORAL
  Filled 2017-06-24: qty 1

## 2017-06-24 MED ORDER — PROPOFOL 10 MG/ML IV BOLUS
INTRAVENOUS | Status: DC | PRN
  Administered 2017-06-24: 150 mg via INTRAVENOUS

## 2017-06-24 MED ORDER — ACETAMINOPHEN 325 MG PO TABS: 650.0000 mg | ORAL_TABLET | ORAL | Status: DC | PRN

## 2017-06-24 MED ORDER — PHENOL 1.4 % MT LIQD: 1.0000 | OROMUCOSAL | Status: DC | PRN

## 2017-06-24 MED ORDER — IRBESARTAN 300 MG PO TABS
300.0000 mg | ORAL_TABLET | Freq: Every day | ORAL | Status: DC
  Administered 2017-06-24 – 2017-06-25 (×2): 300 mg via ORAL
  Filled 2017-06-24 (×3): qty 1

## 2017-06-24 MED ORDER — THROMBIN 5000 UNITS EX SOLR
CUTANEOUS | Status: AC
  Filled 2017-06-24: qty 5000

## 2017-06-24 MED ORDER — ASENAPINE MALEATE 5 MG SL SUBL
5.0000 mg | SUBLINGUAL_TABLET | Freq: Every day | SUBLINGUAL | Status: DC
  Administered 2017-06-24: 5 mg via SUBLINGUAL
  Filled 2017-06-24 (×2): qty 1

## 2017-06-24 MED ORDER — SCOPOLAMINE 1 MG/3DAYS TD PT72: 1.0000 | MEDICATED_PATCH | Freq: Once | TRANSDERMAL | Status: DC

## 2017-06-24 MED ORDER — DEXAMETHASONE SODIUM PHOSPHATE 10 MG/ML IJ SOLN
INTRAMUSCULAR | Status: DC | PRN
  Administered 2017-06-24: 10 mg via INTRAVENOUS

## 2017-06-24 MED ORDER — ESTRADIOL 1 MG PO TABS
1.0000 mg | ORAL_TABLET | Freq: Every day | ORAL | Status: DC
  Administered 2017-06-25: 1 mg via ORAL
  Filled 2017-06-24 (×2): qty 1

## 2017-06-24 MED ORDER — ONDANSETRON HCL 4 MG/2ML IJ SOLN: 4.0000 mg | Freq: Four times a day (QID) | INTRAMUSCULAR | Status: DC | PRN

## 2017-06-24 MED ORDER — ROCURONIUM BROMIDE 100 MG/10ML IV SOLN
INTRAVENOUS | Status: DC | PRN
  Administered 2017-06-24: 10 mg via INTRAVENOUS
  Administered 2017-06-24: 50 mg via INTRAVENOUS
  Administered 2017-06-24: 10 mg via INTRAVENOUS

## 2017-06-24 MED ORDER — HYDROCHLOROTHIAZIDE 25 MG PO TABS
25.0000 mg | ORAL_TABLET | Freq: Every day | ORAL | Status: DC
  Administered 2017-06-24 – 2017-06-25 (×2): 25 mg via ORAL
  Filled 2017-06-24 (×2): qty 1

## 2017-06-24 MED ORDER — BUPIVACAINE LIPOSOME 1.3 % IJ SUSP
20.0000 mL | INTRAMUSCULAR | Status: DC
  Filled 2017-06-24: qty 20

## 2017-06-24 MED ORDER — GABAPENTIN 100 MG PO CAPS
100.0000 mg | ORAL_CAPSULE | Freq: Three times a day (TID) | ORAL | Status: DC
  Administered 2017-06-24 – 2017-06-25 (×3): 100 mg via ORAL
  Filled 2017-06-24 (×3): qty 1

## 2017-06-24 MED ORDER — CLORAZEPATE DIPOTASSIUM 3.75 MG PO TABS
7.5000 mg | ORAL_TABLET | Freq: Two times a day (BID) | ORAL | Status: DC
  Administered 2017-06-24 – 2017-06-25 (×2): 7.5 mg via ORAL
  Filled 2017-06-24 (×2): qty 2

## 2017-06-24 SURGICAL SUPPLY — 78 items
ADH SKN CLS APL DERMABOND .7 (GAUZE/BANDAGES/DRESSINGS) ×1
ALLOFUSE SELECT 5CC (Bone Implant) ×2 IMPLANT
ALLOFUSE SELECT CM 5CC (Bone Implant) ×1 IMPLANT
APL SKNCLS STERI-STRIP NONHPOA (GAUZE/BANDAGES/DRESSINGS) ×1
BAG DECANTER FOR FLEXI CONT (MISCELLANEOUS) ×3 IMPLANT
BENZOIN TINCTURE PRP APPL 2/3 (GAUZE/BANDAGES/DRESSINGS) ×3 IMPLANT
BLADE CLIPPER SURG (BLADE) IMPLANT
BLADE SURG 11 STRL SS (BLADE) ×3 IMPLANT
BUR CUTTER 7.0 ROUND (BURR) ×3 IMPLANT
BUR MATCHSTICK NEURO 3.0 LAGG (BURR) ×3 IMPLANT
CANISTER SUCT 3000ML PPV (MISCELLANEOUS) ×3 IMPLANT
CAP LOCKING THREADED (Cap) ×12 IMPLANT
CARTRIDGE OIL MAESTRO DRILL (MISCELLANEOUS) ×1 IMPLANT
CLOSURE WOUND 1/2 X4 (GAUZE/BANDAGES/DRESSINGS) ×1
CONT SPEC 4OZ CLIKSEAL STRL BL (MISCELLANEOUS) ×3 IMPLANT
COVER BACK TABLE 60X90IN (DRAPES) ×3 IMPLANT
DECANTER SPIKE VIAL GLASS SM (MISCELLANEOUS) ×3 IMPLANT
DERMABOND ADVANCED (GAUZE/BANDAGES/DRESSINGS) ×2
DERMABOND ADVANCED .7 DNX12 (GAUZE/BANDAGES/DRESSINGS) ×1 IMPLANT
DIFFUSER DRILL AIR PNEUMATIC (MISCELLANEOUS) ×3 IMPLANT
DRAPE C-ARM 42X72 X-RAY (DRAPES) ×6 IMPLANT
DRAPE C-ARMOR (DRAPES) IMPLANT
DRAPE HALF SHEET 40X57 (DRAPES) ×6 IMPLANT
DRAPE LAPAROTOMY 100X72X124 (DRAPES) ×3 IMPLANT
DRAPE POUCH INSTRU U-SHP 10X18 (DRAPES) ×3 IMPLANT
DRAPE SURG 17X23 STRL (DRAPES) ×3 IMPLANT
DRSG OPSITE 4X5.5 SM (GAUZE/BANDAGES/DRESSINGS) ×3 IMPLANT
DRSG OPSITE POSTOP 4X6 (GAUZE/BANDAGES/DRESSINGS) ×3 IMPLANT
DURAPREP 26ML APPLICATOR (WOUND CARE) ×3 IMPLANT
ELECT REM PT RETURN 9FT ADLT (ELECTROSURGICAL) ×3
ELECTRODE REM PT RTRN 9FT ADLT (ELECTROSURGICAL) ×1 IMPLANT
EVACUATOR 3/16  PVC DRAIN (DRAIN) ×2
EVACUATOR 3/16 PVC DRAIN (DRAIN) ×1 IMPLANT
GAUZE SPONGE 4X4 12PLY STRL (GAUZE/BANDAGES/DRESSINGS) ×3 IMPLANT
GAUZE SPONGE 4X4 16PLY XRAY LF (GAUZE/BANDAGES/DRESSINGS) IMPLANT
GLOVE BIO SURGEON STRL SZ7 (GLOVE) ×3 IMPLANT
GLOVE BIO SURGEON STRL SZ8 (GLOVE) ×6 IMPLANT
GLOVE BIOGEL PI IND STRL 7.0 (GLOVE) ×1 IMPLANT
GLOVE BIOGEL PI IND STRL 7.5 (GLOVE) ×4 IMPLANT
GLOVE BIOGEL PI INDICATOR 7.0 (GLOVE) ×2
GLOVE BIOGEL PI INDICATOR 7.5 (GLOVE) ×8
GLOVE ECLIPSE 7.0 STRL STRAW (GLOVE) ×3 IMPLANT
GLOVE ECLIPSE 7.5 STRL STRAW (GLOVE) IMPLANT
GLOVE EXAM NITRILE LRG STRL (GLOVE) IMPLANT
GLOVE EXAM NITRILE XL STR (GLOVE) IMPLANT
GLOVE EXAM NITRILE XS STR PU (GLOVE) IMPLANT
GLOVE INDICATOR 8.5 STRL (GLOVE) ×6 IMPLANT
GLOVE SURG SS PI 7.0 STRL IVOR (GLOVE) ×12 IMPLANT
GOWN STRL REUS W/ TWL LRG LVL3 (GOWN DISPOSABLE) ×1 IMPLANT
GOWN STRL REUS W/ TWL XL LVL3 (GOWN DISPOSABLE) ×2 IMPLANT
GOWN STRL REUS W/TWL 2XL LVL3 (GOWN DISPOSABLE) ×3 IMPLANT
GOWN STRL REUS W/TWL LRG LVL3 (GOWN DISPOSABLE) ×3
GOWN STRL REUS W/TWL XL LVL3 (GOWN DISPOSABLE) ×6
HEMOSTAT POWDER KIT SURGIFOAM (HEMOSTASIS) IMPLANT
KIT BASIN OR (CUSTOM PROCEDURE TRAY) ×3 IMPLANT
KIT INFUSE XX SMALL 0.7CC (Orthopedic Implant) ×3 IMPLANT
KIT ROOM TURNOVER OR (KITS) ×3 IMPLANT
NEEDLE HYPO 25X1 1.5 SAFETY (NEEDLE) ×3 IMPLANT
NS IRRIG 1000ML POUR BTL (IV SOLUTION) ×3 IMPLANT
OIL CARTRIDGE MAESTRO DRILL (MISCELLANEOUS) ×3
PACK LAMINECTOMY NEURO (CUSTOM PROCEDURE TRAY) ×3 IMPLANT
PAD ARMBOARD 7.5X6 YLW CONV (MISCELLANEOUS) ×15 IMPLANT
ROD CREO 45MM SPINAL (Rod) ×6 IMPLANT
SCREW CREO 6.5X40 (Screw) ×12 IMPLANT
SCREW PA THRD CREO TULIP 5.5X4 (Head) ×12 IMPLANT
SPACER RISE 8X22 8-14MM-10 (Neuro Prosthesis/Implant) ×6 IMPLANT
SPONGE LAP 4X18 X RAY DECT (DISPOSABLE) IMPLANT
SPONGE SURGIFOAM ABS GEL 100 (HEMOSTASIS) ×6 IMPLANT
STRIP CLOSURE SKIN 1/2X4 (GAUZE/BANDAGES/DRESSINGS) ×2 IMPLANT
SUT VIC AB 0 CT1 18XCR BRD8 (SUTURE) ×2 IMPLANT
SUT VIC AB 0 CT1 8-18 (SUTURE) ×6
SUT VIC AB 2-0 CT1 18 (SUTURE) ×3 IMPLANT
SUT VIC AB 4-0 PS2 27 (SUTURE) ×3 IMPLANT
TISSUE ALLOFUSE SELECT 5CC (Bone Implant) ×1 IMPLANT
TOWEL GREEN STERILE (TOWEL DISPOSABLE) ×3 IMPLANT
TOWEL GREEN STERILE FF (TOWEL DISPOSABLE) ×3 IMPLANT
TRAY FOLEY W/METER SILVER 16FR (SET/KITS/TRAYS/PACK) ×3 IMPLANT
WATER STERILE IRR 1000ML POUR (IV SOLUTION) ×3 IMPLANT

## 2017-06-24 NOTE — Anesthesia Postprocedure Evaluation (Signed)
Anesthesia Post Note  Patient: Tammy Hicks  Procedure(s) Performed: Procedure(s) (LRB): Lumbar Two-Three Posterior lumbar interbody fusion (N/A)     Patient location during evaluation: PACU Anesthesia Type: General Level of consciousness: awake and alert, patient cooperative and oriented Pain management: pain level controlled Vital Signs Assessment: post-procedure vital signs reviewed and stable Respiratory status: spontaneous breathing, nonlabored ventilation, respiratory function stable and patient connected to nasal cannula oxygen Cardiovascular status: blood pressure returned to baseline and stable Postop Assessment: no signs of nausea or vomiting Anesthetic complications: no    Last Vitals:  Vitals:   06/24/17 1520 06/24/17 1554  BP: 126/72 136/76  Pulse: 92 93  Resp: 11 18  Temp: 36.7 C 36.6 C  SpO2: 99% 96%    Last Pain:  Vitals:   06/24/17 1520  TempSrc:   PainSc: 3                  Francetta Ilg,E. Mauriana Dann

## 2017-06-24 NOTE — Evaluation (Signed)
Physical Therapy Evaluation Patient Details Name: Tammy Hicks MRN: [id number] DOB: 01/24/1946 Today's Date: 06/24/2017   History of Present Illness  Pt is a 71 y/o female s/p L2-3 PLIF. PMH includes anxiety, depression, DM, HTN, restless leg syndrome, and OSA on CPAP.   Clinical Impression  Patient is s/p above surgery resulting in the deficits listed below (see PT Problem List). PTA, pt was independent with mobility. Upon eval, pt limited by post op pain and weakness, as well as, slightly decreased balance. Very guarded gait and required HHA and IV pole, with min guard for gait training. Pt reports husband will assist as needed upon d/c home. Patient will benefit from skilled PT to increase their independence and safety with mobility (while adhering to their precautions) to allow discharge to the venue listed below.     Follow Up Recommendations No PT follow up;Supervision for mobility/OOB    Equipment Recommendations  None recommended by PT (has all necessary DME)    Recommendations for Other Services       Precautions / Restrictions Precautions Precautions: Back Precaution Booklet Issued: Yes (comment) Precaution Comments: Reviewed back precautions with pt.  Required Braces or Orthoses: Spinal Brace Spinal Brace: Lumbar corset;Applied in sitting position Restrictions Weight Bearing Restrictions: No      Mobility  Bed Mobility Overal bed mobility: Needs Assistance Bed Mobility: Rolling;Sidelying to Sit;Sit to Sidelying Rolling: Min guard Sidelying to sit: Min guard     Sit to sidelying: Min guard General bed mobility comments: Min guard to ensure appropriate log roll technique.   Transfers Overall transfer level: Needs assistance Equipment used: 1 person hand held assist Transfers: Sit to/from Stand Sit to Stand: Min assist         General transfer comment: Min A for lift assist. Verbal cues to power through BLE.   Ambulation/Gait Ambulation/Gait assistance: Min  guard Ambulation Distance (Feet): 50 Feet Assistive device: 1 person hand held assist (IV pole ) Gait Pattern/deviations: Step-through pattern;Decreased stride length Gait velocity: Decreased Gait velocity interpretation: Below normal speed for age/gender General Gait Details: Slow, very guarded gait. Use of IV pole and HHA for steadying.   Stairs            Wheelchair Mobility    Modified Rankin (Stroke Patients Only)       Balance Overall balance assessment: Needs assistance Sitting-balance support: No upper extremity supported;Feet supported Sitting balance-Leahy Scale: Good Sitting balance - Comments: Reviewed application of brace in sitting    Standing balance support: Bilateral upper extremity supported;During functional activity Standing balance-Leahy Scale: Poor Standing balance comment: Reliant on BUE support for balance                              Pertinent Vitals/Pain Pain Assessment: 0-10 Pain Score: 5  Pain Location: back  Pain Descriptors / Indicators: Aching;Operative site guarding;Sore Pain Intervention(s): Limited activity within patient's tolerance;Monitored during session;Repositioned    Home Living Family/patient expects to be discharged to:: Private residence Living Arrangements: Spouse/significant other;Children;Other relatives Available Help at Discharge: Family;Available 24 hours/day Type of Home: House Home Access: Stairs to enter Entrance Stairs-Rails: None Entrance Stairs-Number of Steps: 3 Home Layout: One level Home Equipment: Walker - 4 wheels;Cane - single point;Bedside commode      Prior Function Level of Independence: Independent               Hand Dominance   Dominant Hand: Right    Extremity/Trunk Assessment  Upper Extremity Assessment Upper Extremity Assessment: Defer to OT evaluation    Lower Extremity Assessment Lower Extremity Assessment: Generalized weakness;LLE deficits/detail LLE Deficits  / Details: Pt reporting no pain in LLE like she had at baseline. Functional weakness noted     Cervical / Trunk Assessment Cervical / Trunk Assessment: Other exceptions Cervical / Trunk Exceptions: s/p PLIF   Communication   Communication: No difficulties  Cognition Arousal/Alertness: Awake/alert Behavior During Therapy: WFL for tasks assessed/performed Overall Cognitive Status: Within Functional Limits for tasks assessed                                        General Comments General comments (skin integrity, edema, etc.): Pt's husband present during session. Educated about generalized walking program to perform at home     Exercises     Assessment/Plan    PT Assessment Patient needs continued PT services  PT Problem List Decreased strength;Decreased range of motion;Decreased balance;Decreased knowledge of precautions;Pain       PT Treatment Interventions Gait training;Stair training;Therapeutic activities;Functional mobility training;Therapeutic exercise;Balance training;Neuromuscular re-education;Patient/family education;DME instruction    PT Goals (Current goals can be found in the Care Plan section)  Acute Rehab PT Goals Patient Stated Goal: to go home  PT Goal Formulation: With patient Time For Goal Achievement: 07/01/17 Potential to Achieve Goals: Good    Frequency Min 5X/week   Barriers to discharge        Co-evaluation               AM-PAC PT "6 Clicks" Daily Activity  Outcome Measure Difficulty turning over in bed (including adjusting bedclothes, sheets and blankets)?: Unable Difficulty moving from lying on back to sitting on the side of the bed? : Unable Difficulty sitting down on and standing up from a chair with arms (e.g., wheelchair, bedside commode, etc,.)?: Unable Help needed moving to and from a bed to chair (including a wheelchair)?: A Little Help needed walking in hospital room?: A Little Help needed climbing 3-5 steps with  a railing? : A Little 6 Click Score: 12    End of Session Equipment Utilized During Treatment: Gait belt;Back brace Activity Tolerance: Patient tolerated treatment well Patient left: in bed;with call bell/phone within reach;with family/visitor present Nurse Communication: Mobility status PT Visit Diagnosis: Other abnormalities of gait and mobility (R26.89);Pain Pain - part of body:  (back)    Time: 4098-1191 PT Time Calculation (min) (ACUTE ONLY): 20 min   Charges:   PT Evaluation $PT Eval Low Complexity: 1 Low PT Treatments $Gait Training: 8-22 mins   PT G Codes:        Gladys Damme, PT, DPT  Acute Rehabilitation Services  Pager: [phone]   Tammy Hicks 06/24/2017, 6:17 PM

## 2017-06-24 NOTE — Transfer of Care (Signed)
Immediate Anesthesia Transfer of Care Note  Patient: Tammy Hicks  Procedure(s) Performed: Procedure(s): Lumbar Two-Three Posterior lumbar interbody fusion (N/A)  Patient Location: PACU  Anesthesia Type:General  Level of Consciousness: awake, alert , oriented and patient cooperative  Airway & Oxygen Therapy: Patient Spontanous Breathing and Patient connected to nasal cannula oxygen  Post-op Assessment: Report given to RN and Post -op Vital signs reviewed and stable  Post vital signs: Reviewed and stable  Last Vitals:  Vitals:   06/24/17 0748  BP: (!) 147/83  Pulse: 71  Resp: 20  Temp: 36.8 C  SpO2: 97%    Last Pain:  Vitals:   06/24/17 0821  TempSrc:   PainSc: 10-Worst pain ever         Complications: No apparent anesthesia complications

## 2017-06-24 NOTE — H&P (Signed)
Tammy Hicks is an 71 y.o. female.   Chief Complaint: Back and bilateral leg pain with neurogenic claudication HPI: 71 year old female with long-standing back pain as previously undergone anterior lateral interbody fusions at L3-4 and L4-5 presents with progressive worsening back pain and difficulty walking pain in both her quads workup revealed severe stenosis and segmental breakdown above her fusion at L2-3. Due to patient's failed conservative treatment imaging findings and progression of clinical syndrome I recommended decompression stabilization procedure at L2-3. I've extensively gone over the risks and benefits of the operation with the patient as well as perioperative course expectations of outcome and alternatives of surgery and she understood and agreed to proceed forward.  Past Medical History:  Diagnosis Date  . Anxiety   . Depression   . Diabetes mellitus without complication (HCC)   . Family history of adverse reaction to anesthesia    "my sister gets really sick afterwards"  . GERD (gastroesophageal reflux disease)   . Headache    migraines  . History of kidney stones   . Hyperlipidemia   . Hypertension   . Insomnia   . Osteoarthritis   . PONV (postoperative nausea and vomiting)    "progresses to migraine"  . Restless leg syndrome   . Sleep apnea    uses CPAP    Past Surgical History:  Procedure Laterality Date  . ABDOMINAL HYSTERECTOMY    . BACK SURGERY     x2   . CESAREAN SECTION    . COLONOSCOPY     x2  . LITHOTRIPSY     "several"  . TONSILLECTOMY      History reviewed. No pertinent family history. Social History:  reports that she has never smoked. She has never used smokeless tobacco. She reports that she does not drink alcohol or use drugs.  Allergies:  Allergies  Allergen Reactions  . Primidone Palpitations and Shortness Of Breath    DYSPNEA  . Actos [Pioglitazone] Swelling and Other (See Comments)    Leg swelling Fatigue  . Codeine Nausea Only     Severe nausea   . Metformin And Related Swelling    Medications Prior to Admission  Medication Sig Dispense Refill  . asenapine (SAPHRIS) 5 MG SUBL 24 hr tablet Place 5 mg under the tongue at bedtime.    . clorazepate (TRANXENE) 7.5 MG tablet Take 7.5 mg by mouth 2 (two) times daily.    Marland Kitchen desvenlafaxine (PRISTIQ) 50 MG 24 hr tablet Take 50 mg by mouth daily.    Marland Kitchen estradiol (ESTRACE) 1 MG tablet Take 1 mg by mouth daily.    Marland Kitchen gabapentin (NEURONTIN) 100 MG capsule Take 100 mg by mouth 3 (three) times daily.    Marland Kitchen glipiZIDE (GLUCOTROL) 5 MG tablet Take 5 mg by mouth 2 (two) times daily before a meal.    . meloxicam (MOBIC) 15 MG tablet Take 25 mg by mouth every 6 (six) hours as needed for pain.    Marland Kitchen olmesartan-hydrochlorothiazide (BENICAR HCT) 40-25 MG tablet Take 1 tablet by mouth daily.    Marland Kitchen omeprazole (PRILOSEC) 20 MG capsule Take 20 mg by mouth daily.    Marland Kitchen oxyCODONE-acetaminophen (PERCOCET) 10-325 MG tablet Take 1 tablet by mouth every 4 (four) hours as needed for pain.    Bertram Gala Glycol-Propyl Glycol (SYSTANE OP) Place 1 drop into both eyes at bedtime as needed (dry eyes).    . potassium chloride SA (K-DUR,KLOR-CON) 20 MEQ tablet Take 20 mEq by mouth 2 (two) times daily.    Marland Kitchen  pramipexole (MIRAPEX) 1 MG tablet Take 1 mg by mouth at bedtime.    . pravastatin (PRAVACHOL) 10 MG tablet Take 10 mg by mouth daily.    . sitaGLIPtin (JANUVIA) 100 MG tablet Take 100 mg by mouth daily.    . Vilazodone HCl (VIIBRYD) 20 MG TABS Take 20 mg by mouth daily.    . butalbital-acetaminophen-caffeine (FIORICET, ESGIC) 50-325-40 MG tablet Take 1 tablet by mouth every 4 (four) hours as needed for migraine.    . fluticasone (FLONASE) 50 MCG/ACT nasal spray Place 1 spray into both nostrils at bedtime as needed for allergies or rhinitis.    . furosemide (LASIX) 40 MG tablet Take 40 mg by mouth daily as needed for fluid.    Marland Kitchen. meclizine (ANTIVERT) 25 MG tablet Take 25 mg by mouth every 6 (six) hours as needed  for dizziness.    Marland Kitchen. zolpidem (AMBIEN CR) 12.5 MG CR tablet Take 12.5 mg by mouth at bedtime as needed for sleep.      Results for orders placed or performed during the hospital encounter of 06/24/17 (from the past 48 hour(s))  Glucose, capillary     Status: Abnormal   Collection Time: 06/24/17  7:53 AM  Result Value Ref Range   Glucose-Capillary 107 (H) 65 - 99 mg/dL   No results found.  Review of Systems  Musculoskeletal: Positive for back pain and myalgias.  Neurological: Positive for tingling and sensory change.    Blood pressure (!) 147/83, pulse 71, temperature 98.2 F (36.8 C), temperature source Oral, resp. rate 20, SpO2 97 %. Physical Exam  Constitutional: She appears well-developed and well-nourished.  HENT:  Head: Normocephalic.  Eyes: Pupils are equal, round, and reactive to light.  Neck: Normal range of motion.  Respiratory: Effort normal.  GI: Soft. Bowel sounds are normal.  Neurological: She has normal strength. GCS eye subscore is 4. GCS verbal subscore is 5. GCS motor subscore is 6.  Strength is 4+ out of 5 iliopsoas bilaterally quads also for placenta 5 distally 5 out of 5 both lower extremities  Skin: Skin is warm and dry.     Assessment/Plan 71 year old female presents for an L2-3 decompression fusion  Tammy Hicks P, MD 06/24/2017, 9:20 AM

## 2017-06-24 NOTE — Op Note (Signed)
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   postoperative diagnosis: Same  Procedure: Decompressive lumbar laminectomy L2-3 with complete medial facetectomies radical foraminotomies in excess and requiring more work than would be needed with a standard interbody fusion  #2 posterior lumbar interbody fusion utilizing the globus rise expandable cage system packed with locally harvested autograft mixed with allostem and BMP  #3 pedicle screw fixation L2-3 utilizing the globus Creole modular pedicle screw set  #4 posterior lateral arthrodesis L2-3 utilizing locally harvested autograft mixed with allsotem and BMP  Surgeon: Jillyn HiddenGary Lachina Salsberry  Asst.: Dr. Rolly PancakeNeelsh Nundkumar Graf anesthesia Gen.  EBL minimal amount   history of present illness : 71 year old female with long-standing back pain and previous undergone L3-L5 fusion initially did very well however last weeks and months is a progress worsening back pain and bilateral leg pain workup revealed severe segmental degeneration above her previous fusion at L2-3. Due to her failure conservative treatment imaging findings and progression of clinical syndrome I recommended decompression stabilization procedure at L2-3. I extensively went over the risks and benefits of the operation with her as well as perioperative course expectations of outcome and alternatives of surgery and she understood and agreed to proceed forward.  Operative procedure: Patient brought into the or was induced on general anesthesia positioned prone the Wilson frame her back was prepped and draped in routine sterile fashion an incision was drawn out just superior to her old incision and the scar tissues dissected free and subperiosteal dissections care lamina of L2 and L3 confirmed by interoperative x-ray. The spinous processes at L2 was then removed central decompression was begun with a high-speed drill to 20  minute Kerrison punch. Complete facetectomies were performed there was marked hourglass compression of thecal sac at the level to 3 disc space this was all unroofed and removed decompressing the central canal as well as decompressing the L2 and L3 nerve roots out the entirety of the foramina. Aggressive undercutting of the superior tickling facet to gain access the lateral margins disc space as well. Epidural veins currently disc space was incised and cleanout bilaterally using sequential sequential distraction with a 9 distractor in place I selected 8-14 expandable 10 lordotic cages packed them with the autograft mix and inserted both cages and inserted local autograft mix centrally between the cages prior to insertion of the second cage. All this was done with fluoroscopy and all back confirmed good position. Then attention second pedicle screw placement using a high-speed drill pilot holes were drilled pedicles were cannulated probed tapped with a 55 Probed again and 6 5 x 40 screws inserted bilaterally at L2 and L3. All screws excellent purchase. Wounds and to see her graft seems this was maintained aggressive decortication was care MTPs or lateral gutters then the remainder the autograft mix was packed posterior laterally along with BMP and then the rods were assembled the nuts were anchored place and tightened down all the foraminal reinspected confirm patency no migration of graft material. Gelfoam was overlaid top of the dura and the wound was angling vancomycin powder Hemovac drain was placed Exparel was injected in the fascia . The wounds and closed in layers after placement of a large Hemovac drain with interrupted Vicryl running 4 septic or and skin Dermabond benzo and Steri-Strips and sterile dressings applied patient recovered in stable condition. At the end of case all needle counts sponge counts were correct.

## 2017-06-24 NOTE — Anesthesia Preprocedure Evaluation (Signed)
Anesthesia Evaluation  Patient identified by MRN, date of birth, ID band Patient awake    Reviewed: Allergy & Precautions, NPO status , Patient's Chart, lab work & pertinent test results  History of Anesthesia Complications (+) PONV  Airway Mallampati: II  TM Distance: >3 FB     Dental  (+) Dental Advisory Given   Pulmonary sleep apnea and Continuous Positive Airway Pressure Ventilation ,    breath sounds clear to auscultation       Cardiovascular hypertension, Pt. on medications (-) angina Rhythm:Regular Rate:Normal     Neuro/Psych  Headaches, Anxiety Depression Chronic back pain    GI/Hepatic Neg liver ROS, GERD  Medicated and Controlled,  Endo/Other  diabetes (glu 107), Oral Hypoglycemic AgentsMorbid obesity  Renal/GU negative Renal ROS     Musculoskeletal  (+) Arthritis ,   Abdominal (+) + obese,   Peds  Hematology negative hematology ROS (+)   Anesthesia Other Findings   Reproductive/Obstetrics                             Anesthesia Physical Anesthesia Plan  ASA: III  Anesthesia Plan: General   Post-op Pain Management:    Induction: Intravenous  PONV Risk Score and Plan: 4 or greater and Ondansetron, Dexamethasone, Midazolam, Scopolamine patch - Pre-op and Propofol infusion  Airway Management Planned: Oral ETT  Additional Equipment:   Intra-op Plan:   Post-operative Plan: Extubation in OR  Informed Consent: I have reviewed the patients History and Physical, chart, labs and discussed the procedure including the risks, benefits and alternatives for the proposed anesthesia with the patient or authorized representative who has indicated his/her understanding and acceptance.   Dental advisory given  Plan Discussed with: CRNA and Surgeon  Anesthesia Plan Comments: (Plan routine monitors, GETA )        Anesthesia Quick Evaluation

## 2017-06-25 LAB — GLUCOSE, CAPILLARY: Glucose-Capillary: 141 mg/dL — ABNORMAL HIGH (ref 65–99)

## 2017-06-25 MED ORDER — OXYCODONE-ACETAMINOPHEN 5-325 MG PO TABS: 1.0000 | ORAL_TABLET | ORAL | 0 refills | Status: DC | PRN

## 2017-06-25 MED ORDER — CYCLOBENZAPRINE HCL 10 MG PO TABS: 10.0000 mg | ORAL_TABLET | Freq: Three times a day (TID) | ORAL | 0 refills | Status: DC | PRN

## 2017-06-25 MED FILL — Gelatin Absorbable Sponge Size 100: CUTANEOUS | Qty: 1 | Status: AC

## 2017-06-25 MED FILL — Thrombin For Soln 20000 Unit: CUTANEOUS | Qty: 1 | Status: AC

## 2017-06-25 NOTE — Discharge Summary (Signed)
Physician Discharge Summary  Patient ID: Tammy Hicks MRN: [id number] DOB/AGE: 70/06/1946 70 y.o.  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 The patient tolerated the procedure well and was taken to the recovery room and then to the floor in stable condition. The hospital course was routine. There were no complications. The wound remained clean dry and intact. Pt had appropriate back soreness. No complaints of leg pain or new N/T/W. The patient remained afebrile with stable vital signs, and tolerated a regular diet. The patient continued to increase activities, and pain was well controlled with oral pain medications.   Consults: None  Significant Diagnostic Studies:  Results for orders placed or performed during the hospital encounter of 06/24/17  Glucose, capillary  Result Value Ref Range   Glucose-Capillary 107 (H) 65 - 99 mg/dL  Glucose, capillary  Result Value Ref Range   Glucose-Capillary 107 (H) 65 - 99 mg/dL   Comment 1 Notify RN    Comment 2 Document in Chart   Glucose, capillary  Result Value Ref Range   Glucose-Capillary 181 (H) 65 - 99 mg/dL  Glucose, capillary  Result Value Ref Range   Glucose-Capillary 254 (H) 65 - 99 mg/dL  Glucose, capillary  Result Value Ref Range   Glucose-Capillary 263 (H) 65 - 99 mg/dL   Comment 1 Notify RN    Comment 2 Document in Chart   Glucose, capillary  Result Value Ref Range   Glucose-Capillary 141 (H) 65 - 99 mg/dL    Dg Lumbar Spine 2-3 Views  Result Date: 06/24/2017 CLINICAL DATA:  L2-3 lumbar fusion EXAM: DG C-ARM 61-120 MIN; LUMBAR SPINE - 2-3 VIEW COMPARISON:  05/31/2017 FINDINGS:  Surgically placed pedicle screws at L2 and L3 bilaterally in good position. Bilateral metal spacers in the L2-3 disc space in good position Interbody fusion at L3-4 L4-5 with spinous process fusion at L3-4 and L4-5 unchanged from the preop study IMPRESSION: Pedicle screw and interbody fusion L2-3 in good position. Electronically Signed   By: Marlan Palauharles  Clark M.D.   On: 06/24/2017 14:31   Dg C-arm 1-60 Min  Result Date: 06/24/2017 CLINICAL DATA:  L2-3 lumbar fusion EXAM: DG C-ARM 61-120 MIN; LUMBAR SPINE - 2-3 VIEW COMPARISON:  05/31/2017 FINDINGS: Surgically placed pedicle screws at L2 and L3 bilaterally in good position. Bilateral metal spacers in the L2-3 disc space in good position Interbody fusion at L3-4 L4-5 with spinous process fusion at L3-4 and L4-5 unchanged from the preop study IMPRESSION: Pedicle screw and interbody fusion L2-3 in good position. Electronically Signed   By: Marlan Palauharles  Clark M.D.   On: 06/24/2017 14:31    Antibiotics:  Anti-infectives    Start     Dose/Rate Route Frequency Ordered Stop   06/24/17 1800  ceFAZolin (ANCEF) IVPB 2g/100 mL premix     2 g 200 mL/hr over 30 Minutes Intravenous Every 8 hours 06/24/17 1546 06/26/17 1759   06/24/17 1312  vancomycin (VANCOCIN) powder  Status:  Discontinued       As needed 06/24/17 1314 06/24/17 1345   06/24/17 1129  bacitracin 50,000 Units in sodium chloride irrigation 0.9 % 500 mL irrigation  Status:  Discontinued       As needed  06/24/17 1130 06/24/17 1345   06/24/17 0930  ceFAZolin (ANCEF) IVPB 2g/100 mL premix     2 g 200 mL/hr over 30 Minutes Intravenous On call to O.R. 06/23/17 1246 06/24/17 1017   06/24/17 0754  ceFAZolin (ANCEF) 2-4 GM/100ML-% IVPB  Status:  Discontinued    Comments:  Rogelia Mire   : cabinet override      06/24/17 0754 06/24/17 0757      Discharge Exam: Blood pressure 92/75, pulse (!) 57, temperature 98.2 F (36.8 C), temperature source Oral, resp. rate 20, height  (1.6 m), weight 81.6 kg (180  lb), SpO2 95 %. Neurologic: Grossly normal Ambulating and voiding well  Discharge Medications:   Allergies as of 06/25/2017      Reactions   Primidone Palpitations, Shortness Of Breath   DYSPNEA   Actos [pioglitazone] Swelling, Other (See Comments)   Leg swelling Fatigue   Codeine Nausea Only   Severe nausea    Metformin And Related Swelling      Medication List    STOP taking these medications   oxyCODONE-acetaminophen 10-325 MG tablet Commonly known as:  PERCOCET Replaced by:  oxyCODONE-acetaminophen 5-325 MG tablet     TAKE these medications   butalbital-acetaminophen-caffeine 50-325-40 MG tablet Commonly known as:  FIORICET, ESGIC Take 1 tablet by mouth every 4 (four) hours as needed for migraine.   clorazepate 7.5 MG tablet Commonly known as:  TRANXENE Take 7.5 mg by mouth 2 (two) times daily.   cyclobenzaprine 10 MG tablet Commonly known as:  FLEXERIL Take 1 tablet (10 mg total) by mouth 3 (three) times daily as needed for muscle spasms.   desvenlafaxine 50 MG 24 hr tablet Commonly known as:  PRISTIQ Take 50 mg by mouth daily.   estradiol 1 MG tablet Commonly known as:  ESTRACE Take 1 mg by mouth daily.   fluticasone 50 MCG/ACT nasal spray Commonly known as:  FLONASE Place 1 spray into both nostrils at bedtime as needed for allergies or rhinitis.   furosemide 40 MG tablet Commonly known as:  LASIX Take 40 mg by mouth daily as needed for fluid.   gabapentin 100 MG capsule Commonly known as:  NEURONTIN Take 100 mg by mouth 3 (three) times daily.   glipiZIDE 5 MG tablet Commonly known as:  GLUCOTROL Take 5 mg by mouth 2 (two) times daily before a meal.   meclizine 25 MG tablet Commonly known as:  ANTIVERT Take 25 mg by mouth every 6 (six) hours as needed for dizziness.   meloxicam 15 MG tablet Commonly known as:  MOBIC Take 25 mg by mouth every 6 (six) hours as needed for pain.   olmesartan-hydrochlorothiazide 40-25 MG tablet Commonly known as:   BENICAR HCT Take 1 tablet by mouth daily.   omeprazole 20 MG capsule Commonly known as:  PRILOSEC Take 20 mg by mouth daily.   oxyCODONE-acetaminophen 5-325 MG tablet Commonly known as:  PERCOCET/ROXICET Take 1 tablet by mouth every 4 (four) hours as needed for moderate pain. Replaces:  oxyCODONE-acetaminophen 10-325 MG tablet   potassium chloride SA 20 MEQ tablet Commonly known as:  K-DUR,KLOR-CON Take 20 mEq by mouth 2 (two) times daily.   pramipexole 1 MG tablet Commonly known as:  MIRAPEX Take 1 mg by mouth at bedtime.   pravastatin 10 MG tablet Commonly known as:  PRAVACHOL Take 10 mg by mouth daily.   SAPHRIS 5 MG Subl 24 hr tablet Generic drug:  asenapine Place 5 mg under the tongue at bedtime.  sitaGLIPtin 100 MG tablet Commonly known as:  JANUVIA Take 100 mg by mouth daily.   SYSTANE OP Place 1 drop into both eyes at bedtime as needed (dry eyes).   VIIBRYD 20 MG Tabs Generic drug:  Vilazodone HCl Take 20 mg by mouth daily.   zolpidem 12.5 MG CR tablet Commonly known as:  AMBIEN CR Take 12.5 mg by mouth at bedtime as needed for sleep.            Discharge Care Instructions        Start     Ordered   06/25/17 0000  cyclobenzaprine (FLEXERIL) 10 MG tablet  3 times daily PRN     06/25/17 0945   06/25/17 0000  oxyCODONE-acetaminophen (PERCOCET/ROXICET) 5-325 MG tablet  Every 4 hours PRN     06/25/17 0945   06/25/17 0000  Increase activity slowly     06/25/17 0945   06/25/17 0000  Diet - low sodium heart healthy     06/25/17 0945   06/25/17 0000  Driving Restrictions    Comments:  No driving for 2 weeks   09/81/19 0945   06/25/17 0000  Lifting restrictions    Comments:  Nothing heavier than 8 lbs   06/25/17 0945   06/25/17 0000   Remove dressing in 72 hours     06/25/17 0945   06/25/17 0000  Call MD for:  extreme fatigue     06/25/17 0945   06/25/17 0000  Call MD for:  persistant dizziness or light-headedness     06/25/17 0945   06/25/17  0000  Call MD for:  hives     06/25/17 0945   06/25/17 0000  Call MD for:  difficulty breathing, headache or visual disturbances     06/25/17 0945   06/25/17 0000  Call MD for:  redness, tenderness, or signs of infection (pain, swelling, redness, odor or green/yellow discharge around incision site)     06/25/17 0945   06/25/17 0000  Call MD for:  severe uncontrolled pain     06/25/17 0945   06/25/17 0000  Call MD for:  persistant nausea and vomiting     06/25/17 0945   06/25/17 0000  Call MD for:  temperature >100.4     06/25/17 0945      Disposition: home   Final Dx: same as admitting  Discharge Instructions     Remove dressing in 72 hours    Complete by:  As directed    Call MD for:  difficulty breathing, headache or visual disturbances    Complete by:  As directed    Call MD for:  extreme fatigue    Complete by:  As directed    Call MD for:  hives    Complete by:  As directed    Call MD for:  persistant dizziness or light-headedness    Complete by:  As directed    Call MD for:  persistant nausea and vomiting    Complete by:  As directed    Call MD for:  redness, tenderness, or signs of infection (pain, swelling, redness, odor or green/yellow discharge around incision site)    Complete by:  As directed    Call MD for:  severe uncontrolled pain    Complete by:  As directed    Call MD for:  temperature >100.4    Complete by:  As directed    Diet - low sodium heart healthy    Complete by:  As directed  Driving Restrictions    Complete by:  As directed    No driving for 2 weeks   Increase activity slowly    Complete by:  As directed    Lifting restrictions    Complete by:  As directed    Nothing heavier than 8 lbs         Signed: Tiana Loft Jeannette Maddy 06/25/2017, 9:46 AM

## 2017-06-25 NOTE — Therapy (Signed)
Occupational Therapy Evaluation Patient Details Name: Tammy Hicks MRN: [id number] DOB: 06/27/1946 Today's Date: 06/25/2017    History of Present Illness Pt is a 71 y/o female s/p L2-3 PLIF. PMH includes anxiety, depression, DM, HTN, restless leg syndrome, and OSA on CPAP.    Clinical Impression   Pt reports being independent in ADLs PTA. Currently pt requires min assist for peri care, min guard for LB ADLs and functional mobility, and supervision/set up for all other ADLs. Pt reports family is able to provide 24 hour assistance as needed. No acute OT needs at this time.     Follow Up Recommendations  No OT follow up;Supervision/Assistance - 24 hour    Equipment Recommendations  None recommended by OT (Pt has all DME needed.)    Recommendations for Other Services       Precautions / Restrictions Precautions Precautions: Back Precaution Comments: Pt able to recall 3/3 back precautions Required Braces or Orthoses: Spinal Brace Spinal Brace: Lumbar corset;Applied in sitting position Restrictions Weight Bearing Restrictions: No      Mobility Bed Mobility Overal bed mobility: Needs Assistance Bed Mobility: Rolling;Sidelying to Sit Rolling: Min guard Sidelying to sit: Min guard       General bed mobility comments: min guard for safety and cueing for log roll technique  Transfers Overall transfer level: Needs assistance Equipment used: None Transfers: Sit to/from Stand Sit to Stand: Min guard         General transfer comment: min guard for safety    Balance Overall balance assessment: Needs assistance Sitting-balance support: No upper extremity supported;Feet supported Sitting balance-Leahy Scale: Good Sitting balance - Comments: Reviewed application of brace in sitting    Standing balance support: During functional activity;No upper extremity supported Standing balance-Leahy Scale: Fair Standing balance comment: Pt reports balance is a little off since surgery but  feels like it is coming along.                           ADL either performed or assessed with clinical judgement   ADL Overall ADL's : Needs assistance/impaired     Grooming: Supervision/safety;Set up;Standing   Upper Body Bathing: Supervision/ safety;Set up;Sitting   Lower Body Bathing: Min guard;Sitting/lateral leans   Upper Body Dressing : Supervision/safety;Set up;Sitting   Lower Body Dressing: Min guard;Sit to/from stand   Toilet Transfer: Supervision/safety;Ambulation;Comfort height toilet   Toileting- Clothing Manipulation and Hygiene: Minimal assistance;Sit to/from stand   Tub/ Shower Transfer: Min guard;Ambulation;3 in 1   Functional mobility during ADLs: Min guard General ADL Comments: Pt able to bring foot over knee to complete LB ADLs. Pt educated on compensatory techniques for completing ADLs and AE to increase independence.     Vision         Perception     Praxis      Pertinent Vitals/Pain Pain Assessment: No/denies pain Pain Score: 4  Pain Location: back  Pain Descriptors / Indicators: Aching;Operative site guarding;Sore Pain Intervention(s): Monitored during session;Repositioned     Hand Dominance Right   Extremity/Trunk Assessment Upper Extremity Assessment Upper Extremity Assessment: Overall WFL for tasks assessed   Lower Extremity Assessment Lower Extremity Assessment: Defer to PT evaluation   Cervical / Trunk Assessment Cervical / Trunk Assessment: Other exceptions Cervical / Trunk Exceptions: s/p PLIF    Communication Communication Communication: No difficulties   Cognition Arousal/Alertness: Awake/alert Behavior During Therapy: WFL for tasks assessed/performed Overall Cognitive Status: Within Functional Limits for tasks assessed  General Comments  Pt's husband was present during session and participated in education on compensatory techniques and AE to complete ADLs.      Exercises     Shoulder Instructions      Home Living Family/patient expects to be discharged to:: Private residence Living Arrangements: Spouse/significant other Available Help at Discharge: Family;Available 24 hours/day Type of Home: House Home Access: Stairs to enter [employer] of Steps: 3 Entrance Stairs-Rails: None Home Layout: One level     Bathroom Shower/Tub: Tub/shower unit;Walk-in shower   Bathroom Toilet: Standard Bathroom Accessibility: Yes How Accessible: Accessible via walker Home Equipment: Walker - 4 wheels;Cane - single point;Bedside commode          Prior Functioning/Environment Level of Independence: Independent                 OT Problem List:        OT Treatment/Interventions:      OT Goals(Current goals can be found in the care plan section) Acute Rehab OT Goals Patient Stated Goal: to go home  OT Goal Formulation: With patient/family  OT Frequency:     Barriers to D/C:            Co-evaluation              AM-PAC PT "6 Clicks" Daily Activity     Outcome Measure Help from another person eating meals?: None Help from another person taking care of personal grooming?: None Help from another person toileting, which includes using toliet, bedpan, or urinal?: A Little Help from another person bathing (including washing, rinsing, drying)?: None Help from another person to put on and taking off regular upper body clothing?: None Help from another person to put on and taking off regular lower body clothing?: A Little 6 Click Score: 22   End of Session Equipment Utilized During Treatment: Gait belt;Back brace Nurse Communication: Mobility status  Activity Tolerance: Patient tolerated treatment well Patient left: in bed;with call bell/phone within reach;with family/visitor present (sitting EOB)  OT Visit Diagnosis: Unsteadiness on feet (R26.81);Pain (Simultaneous filing. User may not have seen previous data.)                 Time: 4098-1191 OT Time Calculation (min): 23 min Charges:  OT General Charges $OT Visit: 1 Visit OT Evaluation $OT Eval Moderate Complexity: 1 Mod OT Treatments $Self Care/Home Management : 8-22 mins G-Codes:     Cammy Copa, OTS [phone]   Cammy Copa 06/25/2017, 11:15 AM

## 2017-06-25 NOTE — Progress Notes (Signed)
Patient is discharged from room 3C10 at this time. Alert and in stable condition. IV site d/c'd and instructions given to patient and husband with understanding verbalized. Left unit via wheelchair with all belongings at side.

## 2017-06-25 NOTE — Progress Notes (Signed)
Physical Therapy Treatment Patient Details Name: Tammy Hicks MRN: [id number] DOB: 1946-08-10 Today's Date: 06/25/2017    History of Present Illness Pt is a 71 y/o female s/p L2-3 PLIF. PMH includes anxiety, depression, DM, HTN, restless leg syndrome, and OSA on CPAP.     PT Comments    Pt making great progress towards achieving her current functional mobility goals. Pt completed stair training this session with husband present throughout. PT will continue to follow acutely to ensure a safe d/c home.   Follow Up Recommendations  No PT follow up;Supervision for mobility/OOB     Equipment Recommendations  None recommended by PT    Recommendations for Other Services       Precautions / Restrictions Precautions Precautions: Back Required Braces or Orthoses: Spinal Brace Spinal Brace: Lumbar corset;Applied in sitting position Restrictions Weight Bearing Restrictions: No    Mobility  Bed Mobility Overal bed mobility: Needs Assistance Bed Mobility: Rolling;Sidelying to Sit Rolling: Min guard Sidelying to sit: Min guard       General bed mobility comments: min guard for safety and cueing for log roll technique  Transfers Overall transfer level: Needs assistance Equipment used: None Transfers: Sit to/from Stand Sit to Stand: Min guard         General transfer comment: min guard for safety  Ambulation/Gait Ambulation/Gait assistance: Min guard Ambulation Distance (Feet): 200 Feet Assistive device: None Gait Pattern/deviations: Step-through pattern;Decreased stride length Gait velocity: Decreased Gait velocity interpretation: Below normal speed for age/gender General Gait Details: very slow, cautious gait but no overt LOB or need for physical assistance, min guard for safety   Stairs Stairs: Yes   Stair Management: One rail Left;Alternating pattern;Step to pattern;Forwards Number of Stairs: 4 General stair comments: pt's husband present during stair  training  Wheelchair Mobility    Modified Rankin (Stroke Patients Only)       Balance Overall balance assessment: Needs assistance Sitting-balance support: No upper extremity supported;Feet supported Sitting balance-Leahy Scale: Good     Standing balance support: During functional activity;No upper extremity supported Standing balance-Leahy Scale: Fair                              Cognition Arousal/Alertness: Awake/alert Behavior During Therapy: WFL for tasks assessed/performed Overall Cognitive Status: Within Functional Limits for tasks assessed                                        Exercises      General Comments        Pertinent Vitals/Pain Pain Assessment: 0-10 Pain Score: 4  Pain Location: back  Pain Descriptors / Indicators: Aching;Operative site guarding;Sore Pain Intervention(s): Monitored during session;Repositioned    Home Living Family/patient expects to be discharged to:: Private residence Living Arrangements: Spouse/significant other                  Prior Function            PT Goals (current goals can now be found in the care plan section) Acute Rehab PT Goals PT Goal Formulation: With patient Time For Goal Achievement: 07/01/17 Potential to Achieve Goals: Good Progress towards PT goals: Progressing toward goals    Frequency    Min 5X/week      PT Plan Current plan remains appropriate    Co-evaluation  AM-PAC PT "6 Clicks" Daily Activity  Outcome Measure  Difficulty turning over in bed (including adjusting bedclothes, sheets and blankets)?: A Little Difficulty moving from lying on back to sitting on the side of the bed? : A Little Difficulty sitting down on and standing up from a chair with arms (e.g., wheelchair, bedside commode, etc,.)?: A Little Help needed moving to and from a bed to chair (including a wheelchair)?: A Little Help needed walking in hospital room?: A  Little Help needed climbing 3-5 steps with a railing? : A Little 6 Click Score: 18    End of Session Equipment Utilized During Treatment: Gait belt;Back brace Activity Tolerance: Patient tolerated treatment well Patient left: in chair;with call bell/phone within reach;with family/visitor present Nurse Communication: Mobility status PT Visit Diagnosis: Other abnormalities of gait and mobility (R26.89);Pain Pain - part of body:  (back)     Time: [id number] PT Time Calculation (min) (ACUTE ONLY): 16 min  Charges:  $Gait Training: 8-22 mins                    G Codes:       GrafJennifer Cooper Stamp, [place]PT, [place] [phone]    Alessandra BevelsJennifer M Corky Blumstein 06/25/2017, 9:40 AM

## 2018-01-25 DIAGNOSIS — H2512 Age-related nuclear cataract, left eye: Secondary | ICD-10-CM | POA: Insufficient documentation

## 2018-07-19 DIAGNOSIS — M7052 Other bursitis of knee, left knee: Secondary | ICD-10-CM | POA: Insufficient documentation

## 2018-07-26 DIAGNOSIS — M1712 Unilateral primary osteoarthritis, left knee: Secondary | ICD-10-CM | POA: Insufficient documentation

## 2018-11-22 DIAGNOSIS — G44209 Tension-type headache, unspecified, not intractable: Secondary | ICD-10-CM | POA: Insufficient documentation

## 2019-12-21 DIAGNOSIS — M76891 Other specified enthesopathies of right lower limb, excluding foot: Secondary | ICD-10-CM | POA: Insufficient documentation

## 2020-06-19 DIAGNOSIS — N183 Chronic kidney disease, stage 3 unspecified: Secondary | ICD-10-CM | POA: Insufficient documentation

## 2020-06-27 DIAGNOSIS — M47816 Spondylosis without myelopathy or radiculopathy, lumbar region: Secondary | ICD-10-CM | POA: Insufficient documentation

## 2020-08-15 DIAGNOSIS — N183 Chronic kidney disease, stage 3 unspecified: Secondary | ICD-10-CM | POA: Insufficient documentation

## 2020-08-15 DIAGNOSIS — N1419 Nephropathy induced by other drugs, medicaments and biological substances: Secondary | ICD-10-CM | POA: Insufficient documentation

## 2020-08-15 DIAGNOSIS — E1122 Type 2 diabetes mellitus with diabetic chronic kidney disease: Secondary | ICD-10-CM | POA: Insufficient documentation

## 2020-10-02 DIAGNOSIS — F5101 Primary insomnia: Secondary | ICD-10-CM | POA: Insufficient documentation

## 2020-12-16 DIAGNOSIS — E559 Vitamin D deficiency, unspecified: Secondary | ICD-10-CM | POA: Insufficient documentation

## 2020-12-16 DIAGNOSIS — M898X9 Other specified disorders of bone, unspecified site: Secondary | ICD-10-CM | POA: Insufficient documentation

## 2021-01-21 ENCOUNTER — Other Ambulatory Visit: Payer: Self-pay | Admitting: Neurosurgery

## 2021-01-21 DIAGNOSIS — M5126 Other intervertebral disc displacement, lumbar region: Secondary | ICD-10-CM | POA: Insufficient documentation

## 2021-01-21 DIAGNOSIS — M5440 Lumbago with sciatica, unspecified side: Secondary | ICD-10-CM | POA: Insufficient documentation

## 2021-02-01 ENCOUNTER — Other Ambulatory Visit: Payer: Self-pay

## 2021-02-01 ENCOUNTER — Ambulatory Visit
Admission: RE | Admit: 2021-02-01 | Discharge: 2021-02-01 | Disposition: A | Discharge status: Home / Self Care | Payer: Medicare Other | Source: Ambulatory Visit | Attending: Neurosurgery | Admitting: Neurosurgery

## 2021-02-01 DIAGNOSIS — M5126 Other intervertebral disc displacement, lumbar region: Secondary | ICD-10-CM

## 2021-03-27 ENCOUNTER — Other Ambulatory Visit: Payer: Self-pay | Admitting: Neurosurgery

## 2021-04-02 ENCOUNTER — Other Ambulatory Visit: Payer: Self-pay

## 2021-04-04 ENCOUNTER — Other Ambulatory Visit: Payer: Self-pay

## 2021-04-04 NOTE — Progress Notes (Signed)
Encounter opened in error

## 2021-04-09 ENCOUNTER — Other Ambulatory Visit: Payer: Self-pay

## 2021-04-22 ENCOUNTER — Other Ambulatory Visit: Payer: Self-pay

## 2021-04-22 ENCOUNTER — Ambulatory Visit: Payer: Medicare Other | Admitting: Vascular Surgery

## 2021-04-22 ENCOUNTER — Encounter: Payer: Self-pay | Admitting: Vascular Surgery

## 2021-04-22 VITALS — BP 105/66 | HR 72 | Temp 97.6°F | Resp 14 | Ht 63.0 in | Wt 161.0 lb

## 2021-04-22 DIAGNOSIS — M5136 Other intervertebral disc degeneration, lumbar region: Secondary | ICD-10-CM | POA: Diagnosis not present

## 2021-04-22 NOTE — Progress Notes (Signed)
Patient name: Tammy Hicks MRN: [id number] DOB: 03-10-1946 Sex: female  REASON FOR CONSULT: Evaluate for abdominal exposure for L5-S1 ALIF  HPI: Tammy Hicks is a 75 y.o. female, with history of hypertension, hyperlipidemia, diabetes and chronic lower back pain that presents for evaluation of L5-S1 ALIF.  Patient has been under the care of Dr. Saintclair Halsted and he asked vascular surgery to evaluate for abdominal approach for L5-S1 ALIF.  Patient states her current episode of back pain started in 2020.  She has failed physical therapy and injections.  She has had previous L2-L3 laminectomy with posterior screws at L2-L3.  Her abdominal surgery includes a abdominal hysterectomy and a C-section.  She has had a CT lumbar spine from 02/02/2021.  Past Medical History:  Diagnosis Date   Anxiety    Depression    Diabetes mellitus without complication ([place])    Family history of adverse reaction to anesthesia    "my sister gets really sick afterwards"   GERD (gastroesophageal reflux disease)    Headache    migraines   History of kidney stones    Hyperlipidemia    Hypertension    Insomnia    Osteoarthritis    PONV (postoperative nausea and vomiting)    "progresses to migraine"   Restless leg syndrome    Sleep apnea    uses CPAP    Past Surgical History:  Procedure Laterality Date   ABDOMINAL HYSTERECTOMY     BACK SURGERY     x2    CESAREAN SECTION     COLONOSCOPY     x2   LITHOTRIPSY     "several"   TONSILLECTOMY      History reviewed. No pertinent family history.  SOCIAL HISTORY: Social History   Socioeconomic History   Marital status: Married    Spouse name: Not on file   Number of children: Not on file   Years of education: Not on file   Highest education level: Not on file  Occupational History   Not on file  Tobacco Use   Smoking status: Never   Smokeless tobacco: Never  Vaping Use   Vaping Use: Never used  Substance and Sexual Activity   Alcohol use: No   Drug use:  No   Sexual activity: Not on file  Other Topics Concern   Not on file  Social History Narrative   Not on file   Social Determinants of Health   Financial Resource Strain: Not on file  Food Insecurity: Not on file  Transportation Needs: Not on file  Physical Activity: Not on file  Stress: Not on file  Social Connections: Not on file  Intimate Partner Violence: Not on file    Allergies  Allergen Reactions   Pioglitazone Swelling and Other (See Comments)    Leg swelling Fatigue AKA: ACTOS  Leg swelling   Primidone Palpitations, Shortness Of Breath and Other (See Comments)    DYSPNEA   Metformin Swelling   Metformin And Related Swelling   Codeine Nausea Only and Nausea And Vomiting    Severe nausea     Current Outpatient Medications  Medication Sig Dispense Refill   acetaminophen (TYLENOL) 325 MG tablet Take 1 tablet by mouth as needed.     ACETAMINOPHEN-BUTALBITAL 50-325 MG TABS Take by mouth.     aspirin 81 MG EC tablet Take by mouth.     azelastine (ASTELIN) 0.1 % nasal spray Place into the nose.     Blood Glucose Monitoring Suppl (ONE  TOUCH ULTRA 2) w/Device KIT Use as instructed. Check blood sugar 2 times per day. Dx. E11.9     clotrimazole-betamethasone (LOTRISONE) cream Apply to affected area 2 times daily prn     desvenlafaxine (PRISTIQ) 50 MG 24 hr tablet Take 1 tablet by mouth daily.     ergocalciferol (VITAMIN D2) 1.25 MG (50000 UT) capsule Take 1 capsule by mouth once a week.     fluticasone (FLONASE) 50 MCG/ACT nasal spray 1 spray by Each Nare route daily.     furosemide (LASIX) 40 MG tablet Take 40 mg by mouth daily as needed for fluid.     gabapentin (NEURONTIN) 300 MG capsule TAKE 1 CAPSULE BY MOUTH IN  THE MORNING AND IN THE  AFTERNOON AND TAKE 2  CAPSULES BY MOUTH AT  BEDTIME     glipiZIDE (GLUCOTROL XL) 10 MG 24 hr tablet Take 10 mg by mouth 2 (two) times daily.     glipiZIDE (GLUCOTROL XL) 5 MG 24 hr tablet Take 5 mg by mouth 2 (two) times daily.      HYDROcodone-acetaminophen (NORCO) 10-325 MG tablet Take by mouth.     hydrOXYzine (ATARAX/VISTARIL) 25 MG tablet      insulin regular (NOVOLIN R) 100 units/mL injection ADMINISTER 10 UNITS UNDER THE SKIN THREE TIMES DAILY     Insulin Syringe-Needle U-100 (B-D INS SYR ULTRAFINE 1CC/30G) 30G X 1/2" 1 ML MISC See admin instructions.     Insulin Syringe-Needle U-100 25G X 1" 1 ML MISC Use 3 times a day as needed     meclizine (ANTIVERT) 25 MG tablet Take 25 mg by mouth every 6 (six) hours as needed for dizziness.     metFORMIN (GLUCOPHAGE) 500 MG tablet      olmesartan-hydrochlorothiazide (BENICAR HCT) 40-25 MG tablet Take 1 tablet by mouth daily.     omeprazole (PRILOSEC) 20 MG capsule Take 1 capsule by mouth daily.     OneTouch Delica Lancets 51T MISC 1 each by Other route 2 times daily. Dx E11.9     oxyCODONE-acetaminophen (PERCOCET/ROXICET) 5-325 MG tablet Take 1 tablet by mouth every 4 (four) hours as needed for moderate pain. 30 tablet 0   potassium chloride (KLOR-CON) 10 MEQ tablet Take 10 mEq by mouth 2 (two) times daily.     sitaGLIPtin (JANUVIA) 100 MG tablet Take 1 tablet by mouth daily.     tiZANidine (ZANAFLEX) 4 MG capsule TAKE 1 CAPSULE BY MOUTH 3 TIMES DAILY AS NEEDED     Vitamin D, Ergocalciferol, (DRISDOL) 1.25 MG (50000 UNIT) CAPS capsule Take 1 capsule by mouth once a week.     zolpidem (AMBIEN CR) 12.5 MG CR tablet Take 12.5 mg by mouth at bedtime as needed for sleep.     asenapine (SAPHRIS) 5 MG SUBL 24 hr tablet Place 5 mg under the tongue at bedtime. (Patient not taking: Reported on 04/22/2021)     clorazepate (TRANXENE) 7.5 MG tablet Take 7.5 mg by mouth 2 (two) times daily. (Patient not taking: Reported on 04/22/2021)     conjugated estrogens (PREMARIN) vaginal cream Place vaginally. (Patient not taking: Reported on 04/22/2021)     cyclobenzaprine (FLEXERIL) 10 MG tablet Take 1 tablet (10 mg total) by mouth 3 (three) times daily as needed for muscle spasms. (Patient not taking:  Reported on 04/22/2021) 30 tablet 0   Diclofenac Potassium,Migraine, 50 MG PACK Take by mouth. (Patient not taking: Reported on 04/22/2021)     Docusate Sodium (DSS) 100 MG CAPS Frequency:   Dosage:0  MG  Instructions:  Note:takes as needed (Patient not taking: Reported on 04/22/2021)     estradiol (ESTRACE) 1 MG tablet Take 1 mg by mouth daily. (Patient not taking: Reported on 04/22/2021)     Estradiol 10 MCG TABS vaginal tablet Place in vagina q. Mondays and Thursdays at bedtime (Patient not taking: Reported on 04/22/2021)     Estradiol 10 MCG TABS vaginal tablet Place 1 tablet vaginally 2 (two) times a week. (Patient not taking: Reported on 04/22/2021)     Hyoscyamine Sulfate SL 0.125 MG SUBL Place under the tongue.     meloxicam (MOBIC) 15 MG tablet Take 25 mg by mouth every 6 (six) hours as needed for pain. (Patient not taking: Reported on 04/22/2021)     methylPREDNISolone (MEDROL DOSEPAK) 4 MG TBPK tablet Take by mouth. (Patient not taking: Reported on 04/22/2021)     Multiple Vitamin (MULTIVITAMIN ADULT PO) Frequency:   Dosage:0.0     Instructions:Multiple Vitamins ( TABS, 1 Oral )  Note: (Patient not taking: Reported on 04/22/2021)     Polyethyl Glycol-Propyl Glycol (SYSTANE FREE OP) Apply to eye. (Patient not taking: Reported on 04/22/2021)     Polyethyl Glycol-Propyl Glycol (SYSTANE OP) Place 1 drop into both eyes at bedtime as needed (dry eyes). (Patient not taking: Reported on 04/22/2021)     traMADol (ULTRAM) 50 MG tablet Take 50-100 mg by mouth every 6 (six) hours as needed. (Patient not taking: Reported on 04/22/2021)     Vilazodone HCl 20 MG TABS Take 20 mg by mouth daily.     No current facility-administered medications for this visit.    REVIEW OF SYSTEMS:  '[X]'  denotes positive finding, '[ ]'  denotes negative finding Cardiac  Comments:  Chest pain or chest pressure:    Shortness of breath upon exertion:    Short of breath when lying flat:    Irregular heart rhythm:        Vascular    Pain in  calf, thigh, or hip brought on by ambulation:    Pain in feet at night that wakes you up from your sleep:     Blood clot in your veins:    Leg swelling:  x       Pulmonary    Oxygen at home:    Productive cough:     Wheezing:         Neurologic    Sudden weakness in arms or legs:     Sudden numbness in arms or legs:     Sudden onset of difficulty speaking or slurred speech:    Temporary loss of vision in one eye:     Problems with dizziness:         Gastrointestinal    Blood in stool:     Vomited blood:         Genitourinary    Burning when urinating:     Blood in urine:        Psychiatric    Major depression:         Hematologic    Bleeding problems:    Problems with blood clotting too easily:        Skin    Rashes or ulcers:        Constitutional    Fever or chills:      PHYSICAL EXAM: Vitals:   04/22/21 1144  BP: 105/66  Pulse: 72  Resp: 14  Temp: 97.6 F (36.4 C)  TempSrc: Temporal  SpO2: 97%  Weight: 161  lb (73 kg)  Height: '5\' 3"'  (1.6 m)    GENERAL: The patient is a well-nourished female, in no acute distress. The vital signs are documented above. CARDIAC: There is a regular rate and rhythm.  VASCULAR:  Palpable femoral pulses bilaterally Palpable DP pulses bilateral PULMONARY: No respiratory distress. ABDOMEN: Soft and non-tender .  Lower abdominal transverse incision. MUSCULOSKELETAL: There are no major deformities or cyanosis. NEUROLOGIC: No focal weakness or paresthesias are detected. SKIN: There are no ulcers or rashes noted. PSYCHIATRIC: The patient has a normal affect.  DATA:   CT lumbar spine reviewed from 02/01/2021 and the aortic bifurcation at L4 and vein bifurcation appears to be at L4-L5 disc space.  Assessment/Plan:  75 year old female with chronic lower back pain who presents for preop evaluation of abdominal exposure for L5-S1 ALIF.  I have reviewed her CT lumbar spine from 02/02/2021 and also after physical exam, I think she  would be a good candidate for anterior approach.  I discussed a transverse incision over the left rectus muscle where the L5-S1 disc space will be marked.  Discussed mobilizing the left rectus muscle and then entering the retroperitoneum to mobilize the peritoneum and left ureter across midline.  We discussed mobilizing iliac artery and vein if necessary.  Discussed risk and injury to the above structures.  All questions answered.  Look forward to assisting Dr. Saintclair Halsted.   [name], MD [facility]: [phone]

## 2021-05-01 ENCOUNTER — Encounter (HOSPITAL_COMMUNITY): Payer: Self-pay

## 2021-05-01 ENCOUNTER — Other Ambulatory Visit: Payer: Self-pay

## 2021-05-01 ENCOUNTER — Encounter (HOSPITAL_COMMUNITY)
Admission: RE | Admit: 2021-05-01 | Discharge: 2021-05-01 | Disposition: A | Discharge status: Home / Self Care | Payer: Medicare Other | Source: Ambulatory Visit | Attending: Neurosurgery | Admitting: Neurosurgery

## 2021-05-01 DIAGNOSIS — Z01818 Encounter for other preprocedural examination: Secondary | ICD-10-CM | POA: Insufficient documentation

## 2021-05-01 DIAGNOSIS — Z20822 Contact with and (suspected) exposure to covid-19: Secondary | ICD-10-CM | POA: Insufficient documentation

## 2021-05-01 HISTORY — DX: Chronic kidney disease, unspecified: N18.9

## 2021-05-01 LAB — BASIC METABOLIC PANEL
Anion gap: 7 (ref 5–15)
BUN: 21 mg/dL (ref 8–23)
CO2: 25 mmol/L (ref 22–32)
Calcium: 9.6 mg/dL (ref 8.9–10.3)
Chloride: 104 mmol/L (ref 98–111)
Creatinine, Ser: 1.03 mg/dL — ABNORMAL HIGH (ref 0.44–1.00)
GFR, Estimated: 57 mL/min — ABNORMAL LOW (ref >60)
Glucose, Bld: 81 mg/dL (ref 70–99)
Potassium: 3.8 mmol/L (ref 3.5–5.1)
Sodium: 136 mmol/L (ref 135–145)

## 2021-05-01 LAB — SURGICAL PCR SCREEN
MRSA, PCR: NEGATIVE
Staphylococcus aureus: NEGATIVE

## 2021-05-01 LAB — GLUCOSE, CAPILLARY: Glucose-Capillary: 107 mg/dL — ABNORMAL HIGH (ref 70–99)

## 2021-05-01 LAB — CBC
HCT: 36.4 % (ref 36.0–46.0)
Hemoglobin: 11.8 g/dL — ABNORMAL LOW (ref 12.0–15.0)
MCH: 29.7 pg (ref 26.0–34.0)
MCHC: 32.4 g/dL (ref 30.0–36.0)
MCV: 91.7 fL (ref 80.0–100.0)
Platelets: 354 10*3/uL (ref 150–400)
RBC: 3.97 MIL/uL (ref 3.87–5.11)
RDW: 13.4 % (ref 11.5–15.5)
WBC: 9 10*3/uL (ref 4.0–10.5)
nRBC: 0 % (ref 0.0–0.2)

## 2021-05-01 LAB — SARS CORONAVIRUS 2 (TAT 6-24 HRS): SARS Coronavirus 2: NEGATIVE

## 2021-05-01 LAB — HEMOGLOBIN A1C
Hgb A1c MFr Bld: 6 % — ABNORMAL HIGH (ref 4.8–5.6)
Mean Plasma Glucose: 125.5 mg/dL

## 2021-05-01 NOTE — Progress Notes (Signed)
PCP -Cheron Schaumann   Cardiologist - none Nephrologist - Dr. Lequita Halt  PPM/ICD - denies Device Orders -  Rep Notified -   Chest x-ray - 11/08/10 EKG - 05/01/21 Stress Test - none ECHO - none Cardiac Cath - none  Sleep Study - sleep study 14 years ago with Dr. Jerolyn Shin. No records CPAP -yes   Fasting Blood Sugar - 75-95 Checks Blood Sugar 2 times a day  Blood Thinner Instructions:n/a Aspirin Instructions:pt states she is not taking  ERAS Protcol -no PRE-SURGERY Ensure or G2-   COVID TEST- 05/01/21   Anesthesia review: yes -abnormal EKG  Patient denies shortness of breath, fever, cough and chest pain at PAT appointment   All instructions explained to the patient, with a verbal understanding of the material. Patient agrees to go over the instructions while at home for a better understanding. Patient also instructed to self quarantine after being tested for COVID-19. The opportunity to ask questions was provided.

## 2021-05-01 NOTE — Progress Notes (Addendum)
Surgical Instructions    Your procedure is scheduled on Monday July 18.  Report to [facility] Main Entrance "A" at 5:30 A.M., then check in with the Admitting office.  Call this number if you have problems the morning of surgery:  [phone]   If you have any questions prior to your surgery date call [phone]: Open Monday-Friday 8am-4pm    Remember:  Do not eat or drink after midnight the night before your surgery     Take these medicines the morning of surgery with A SIP OF WATER:              acetaminophen (TYLENOL)if needed              Azelastine (ASTELIN) 0.1 % nasal spray if needed              desvenlafaxine (PRISTIQ)              fluticasone (FLONASE) 50 MCG/ACT nasal spray if needed              gabapentin (NEURONTIN)               hydrOXYzine (ATARAX/VISTARIL) if needed              meclizine (ANTIVERT) if needed              omeprazole (PRILOSEC)              oxyCODONE-acetaminophen (PERCOCET) if needed              pravastatin (PRAVACHOL)               traMADol (ULTRAM) if needed   As of today, STOP taking any Aspirin (unless otherwise instructed by your surgeon) Aleve, Naproxen, Ibuprofen, Motrin, Advil, Goody's, BC's, all herbal medications, fish oil, and all vitamins.   WHAT DO I DO ABOUT MY DIABETES MEDICATION?   DO NOT  take oral diabetes medicines (pills)- glipiZIDE (GLUCOTROL XL),  metFORMIN (GLUCOPHAGE), and sitaGLIPtin (JANUVIA) the morning of surgery  If your CBG is greater than 220 mg/dL the morning of surgery, you may take  of your insulin regular (NOVOLIN R) sliding scale (correction) dose of insulin.   HOW TO MANAGE YOUR DIABETES BEFORE AND AFTER SURGERY  Why is it important to control my blood sugar before and after surgery? Improving blood sugar levels before and after surgery helps healing and can limit problems. A way of improving blood sugar control is eating a healthy diet by:  Eating less sugar and carbohydrates  Increasing  activity/exercise  Talking with your doctor about reaching your blood sugar goals High blood sugars (greater than 180 mg/dL) can raise your risk of infections and slow your recovery, so you will need to focus on controlling your diabetes during the weeks before surgery. Make sure that the doctor who takes care of your diabetes knows about your planned surgery including the date and location.  How do I manage my blood sugar before surgery? Check your blood sugar at least 4 times a day, starting 2 days before surgery, to make sure that the level is not too high or low.  Check your blood sugar the morning of your surgery when you wake up and every 2 hours until you get to the Short Stay unit.  If your blood sugar is less than 70 mg/dL, you will need to treat for low blood sugar: Do not take insulin. Treat a low blood sugar (less than 70 mg/dL) with  cup of  clear juice (cranberry or apple), 4 glucose tablets, OR glucose gel. Recheck blood sugar in 15 minutes after treatment (to make sure it is greater than 70 mg/dL). If your blood sugar is not greater than 70 mg/dL on recheck, call [phone] for further instructions. Report your blood sugar to the short stay nurse when you get to Short Stay.  If you are admitted to the hospital after surgery: Your blood sugar will be checked by the staff and you will probably be given insulin after surgery (instead of oral diabetes medicines) to make sure you have good blood sugar levels. The goal for blood sugar control after surgery is 80-180 mg/dL.             Do not wear jewelry or makeup Do not wear lotions, powders, perfumes/colognes, or deodorant. Do not shave 48 hours prior to surgery.  Men may shave face and neck. Do not bring valuables to the hospital. DO Not wear nail polish, gel polish, artificial nails, or any other type of covering on  natural nails including finger and toenails. If patients have artificial nails, gel coating, etc. that need  to be removed by a nail salon please have this removed prior to surgery or surgery may need to be canceled/delayed if the surgeon/ anesthesia feels like the patient is unable to be adequately monitored.             [facility] is not responsible for any belongings or valuables.  Do NOT Smoke (Tobacco/Vaping) or drink Alcohol 24 hours prior to your procedure If you use a CPAP at night, yodesvenlafaxine (PRISTIQ)u may bring all equipment for your overnight stay.   Contacts, glasses, dentures or bridgework may not be worn into surgery, please bring cases for these belongings   For patients admitted to the hospital, discharge time will be determined by your treatment team.   Patients discharged the day of surgery will not be allowed to drive home, and someone needs to stay with them for 24 hours.  ONLY 1 SUPPORT PERSON MAY BE PRESENT WHILE YOU ARE IN SURGERY. IF YOU ARE TO BE ADMITTED ONCE YOU ARE IN YOUR ROOM YOU WILL BE ALLOWED TWO (2) VISITORS.  Minor children may have two parents present. Special consideration for safety and communication needs will be reviewed on a case by case basis.  Special instructions:    Oral Hygiene is also important to reduce your risk of infection.  Remember - BRUSH YOUR TEETH THE MORNING OF SURGERY WITH YOUR REGULAR TOOTHPASTE   [facility]- Preparing For Surgery  Before surgery, you can play an important role. Because skin is not sterile, your skin needs to be as free of germs as possible. You can reduce the number of germs on your skin by washing with CHG (chlorahexidine gluconate) Soap before surgery.  CHG is an antiseptic cleaner which kills germs and bonds with the skin to continue killing germs even after washing.     Please do not use if you have an allergy to CHG or antibacterial soaps. If your skin becomes reddened/irritated stop using the CHG.  Do not shave (including legs and underarms) for at least 48 hours prior to first CHG shower. It is OK to shave  your face.  Please follow these instructions carefully.     Shower the NIGHT BEFORE SURGERY and the MORNING OF SURGERY with CHG Soap.   If you chose to wash your hair, wash your hair first as usual with your normal shampoo. After you  shampoo, rinse your hair and body thoroughly to remove the shampoo.  Then [employer] and genitals (private parts) with your normal soap and rinse thoroughly to remove soap.  After that Use CHG Soap as you would any other liquid soap. You can apply CHG directly to the skin and wash gently with a scrungie or a clean washcloth.   Apply the CHG Soap to your body ONLY FROM THE NECK DOWN.  Do not use on open wounds or open sores. Avoid contact with your eyes, ears, mouth and genitals (private parts). Wash Face and genitals (private parts)  with your normal soap.   Wash thoroughly, paying special attention to the area where your surgery will be performed.  Thoroughly rinse your body with warm water from the neck down.  DO NOT shower/wash with your normal soap after using and rinsing off the CHG Soap.  Pat yourself dry with a CLEAN TOWEL.  Wear CLEAN PAJAMAS to bed the night before surgery  Place CLEAN SHEETS on your bed the night before your surgery  DO NOT SLEEP WITH PETS.   Day of Surgery:  Take a shower with CHG soap. Wear Clean/Comfortable clothing the morning of surgery Do not apply any deodorants/lotions.   Remember to brush your teeth WITH YOUR REGULAR TOOTHPASTE.   Please read over the following fact sheets that you were given.

## 2021-05-05 ENCOUNTER — Encounter (HOSPITAL_COMMUNITY)
Admission: RE | Disposition: A | Discharge status: Home / Self Care | Payer: Self-pay | Source: Home / Self Care | Attending: Neurosurgery

## 2021-05-05 ENCOUNTER — Inpatient Hospital Stay (HOSPITAL_COMMUNITY): Payer: Medicare Other

## 2021-05-05 ENCOUNTER — Other Ambulatory Visit: Payer: Self-pay

## 2021-05-05 ENCOUNTER — Encounter (HOSPITAL_COMMUNITY): Payer: Self-pay | Admitting: Neurosurgery

## 2021-05-05 ENCOUNTER — Inpatient Hospital Stay (HOSPITAL_COMMUNITY): Payer: Medicare Other | Admitting: Anesthesiology

## 2021-05-05 ENCOUNTER — Inpatient Hospital Stay (HOSPITAL_COMMUNITY)
Admission: RE | Admit: 2021-05-05 | Discharge: 2021-05-09 | DRG: 453 | Disposition: A | Discharge status: Home / Self Care | Payer: Medicare Other | Attending: Neurosurgery | Admitting: Neurosurgery

## 2021-05-05 ENCOUNTER — Inpatient Hospital Stay (HOSPITAL_COMMUNITY): Payer: Medicare Other | Admitting: Vascular Surgery

## 2021-05-05 DIAGNOSIS — M4186 Other forms of scoliosis, lumbar region: Secondary | ICD-10-CM

## 2021-05-05 DIAGNOSIS — E785 Hyperlipidemia, unspecified: Secondary | ICD-10-CM | POA: Diagnosis present

## 2021-05-05 DIAGNOSIS — Z7982 Long term (current) use of aspirin: Secondary | ICD-10-CM

## 2021-05-05 DIAGNOSIS — Z87442 Personal history of urinary calculi: Secondary | ICD-10-CM

## 2021-05-05 DIAGNOSIS — F05 Delirium due to known physiological condition: Secondary | ICD-10-CM | POA: Diagnosis not present

## 2021-05-05 DIAGNOSIS — N179 Acute kidney failure, unspecified: Secondary | ICD-10-CM | POA: Diagnosis not present

## 2021-05-05 DIAGNOSIS — M4180 Other forms of scoliosis, site unspecified: Secondary | ICD-10-CM | POA: Diagnosis present

## 2021-05-05 DIAGNOSIS — E1122 Type 2 diabetes mellitus with diabetic chronic kidney disease: Secondary | ICD-10-CM | POA: Diagnosis present

## 2021-05-05 DIAGNOSIS — G2581 Restless legs syndrome: Secondary | ICD-10-CM | POA: Diagnosis present

## 2021-05-05 DIAGNOSIS — K219 Gastro-esophageal reflux disease without esophagitis: Secondary | ICD-10-CM | POA: Diagnosis present

## 2021-05-05 DIAGNOSIS — M1990 Unspecified osteoarthritis, unspecified site: Secondary | ICD-10-CM | POA: Diagnosis present

## 2021-05-05 DIAGNOSIS — Z20822 Contact with and (suspected) exposure to covid-19: Secondary | ICD-10-CM | POA: Diagnosis present

## 2021-05-05 DIAGNOSIS — I129 Hypertensive chronic kidney disease with stage 1 through stage 4 chronic kidney disease, or unspecified chronic kidney disease: Secondary | ICD-10-CM | POA: Diagnosis present

## 2021-05-05 DIAGNOSIS — Z419 Encounter for procedure for purposes other than remedying health state, unspecified: Secondary | ICD-10-CM

## 2021-05-05 DIAGNOSIS — M532X6 Spinal instabilities, lumbar region: Secondary | ICD-10-CM | POA: Diagnosis present

## 2021-05-05 DIAGNOSIS — R1310 Dysphagia, unspecified: Secondary | ICD-10-CM | POA: Diagnosis not present

## 2021-05-05 DIAGNOSIS — Z888 Allergy status to other drugs, medicaments and biological substances status: Secondary | ICD-10-CM

## 2021-05-05 DIAGNOSIS — Z794 Long term (current) use of insulin: Secondary | ICD-10-CM

## 2021-05-05 DIAGNOSIS — G928 Other toxic encephalopathy: Secondary | ICD-10-CM | POA: Diagnosis not present

## 2021-05-05 DIAGNOSIS — M4807 Spinal stenosis, lumbosacral region: Secondary | ICD-10-CM | POA: Diagnosis present

## 2021-05-05 DIAGNOSIS — Z7984 Long term (current) use of oral hypoglycemic drugs: Secondary | ICD-10-CM | POA: Diagnosis not present

## 2021-05-05 DIAGNOSIS — I959 Hypotension, unspecified: Secondary | ICD-10-CM | POA: Diagnosis not present

## 2021-05-05 DIAGNOSIS — G4730 Sleep apnea, unspecified: Secondary | ICD-10-CM | POA: Diagnosis present

## 2021-05-05 DIAGNOSIS — G4700 Insomnia, unspecified: Secondary | ICD-10-CM | POA: Diagnosis present

## 2021-05-05 DIAGNOSIS — R0902 Hypoxemia: Secondary | ICD-10-CM | POA: Diagnosis not present

## 2021-05-05 DIAGNOSIS — M48061 Spinal stenosis, lumbar region without neurogenic claudication: Principal | ICD-10-CM | POA: Diagnosis present

## 2021-05-05 DIAGNOSIS — G9340 Encephalopathy, unspecified: Secondary | ICD-10-CM | POA: Diagnosis not present

## 2021-05-05 DIAGNOSIS — M5116 Intervertebral disc disorders with radiculopathy, lumbar region: Secondary | ICD-10-CM | POA: Diagnosis present

## 2021-05-05 DIAGNOSIS — R479 Unspecified speech disturbances: Secondary | ICD-10-CM | POA: Diagnosis not present

## 2021-05-05 DIAGNOSIS — N183 Chronic kidney disease, stage 3 unspecified: Secondary | ICD-10-CM | POA: Diagnosis present

## 2021-05-05 DIAGNOSIS — Z885 Allergy status to narcotic agent status: Secondary | ICD-10-CM

## 2021-05-05 DIAGNOSIS — Z452 Encounter for adjustment and management of vascular access device: Secondary | ICD-10-CM

## 2021-05-05 DIAGNOSIS — D649 Anemia, unspecified: Secondary | ICD-10-CM | POA: Diagnosis present

## 2021-05-05 DIAGNOSIS — M532X7 Spinal instabilities, lumbosacral region: Secondary | ICD-10-CM

## 2021-05-05 DIAGNOSIS — Z79899 Other long term (current) drug therapy: Secondary | ICD-10-CM

## 2021-05-05 DIAGNOSIS — Z90710 Acquired absence of both cervix and uterus: Secondary | ICD-10-CM

## 2021-05-05 HISTORY — PX: LUMBAR LAMINECTOMY/DECOMPRESSION MICRODISCECTOMY: SHX5026

## 2021-05-05 HISTORY — PX: ANTERIOR LUMBAR FUSION: SHX1170

## 2021-05-05 HISTORY — PX: ABDOMINAL EXPOSURE: SHX5708

## 2021-05-05 HISTORY — PX: LUMBAR PERCUTANEOUS PEDICLE SCREW 1 LEVEL: SHX5560

## 2021-05-05 HISTORY — PX: ANTERIOR LAT LUMBAR FUSION: SHX1168

## 2021-05-05 HISTORY — PX: APPLICATION OF INTRAOPERATIVE CT SCAN: SHX6668

## 2021-05-05 LAB — CBC
HCT: 28 % — ABNORMAL LOW (ref 36.0–46.0)
Hemoglobin: 9.4 g/dL — ABNORMAL LOW (ref 12.0–15.0)
MCH: 30.6 pg (ref 26.0–34.0)
MCHC: 33.6 g/dL (ref 30.0–36.0)
MCV: 91.2 fL (ref 80.0–100.0)
Platelets: 252 10*3/uL (ref 150–400)
RBC: 3.07 MIL/uL — ABNORMAL LOW (ref 3.87–5.11)
RDW: 13.6 % (ref 11.5–15.5)
WBC: 17.1 10*3/uL — ABNORMAL HIGH (ref 4.0–10.5)
nRBC: 0 % (ref 0.0–0.2)

## 2021-05-05 LAB — POCT I-STAT 7, (LYTES, BLD GAS, ICA,H+H)
Acid-base deficit: 2 mmol/L (ref 0.0–2.0)
Acid-base deficit: 4 mmol/L — ABNORMAL HIGH (ref 0.0–2.0)
Bicarbonate: 23.6 mmol/L (ref 20.0–28.0)
Bicarbonate: 21.7 mmol/L (ref 20.0–28.0)
Calcium, Ion: 1.29 mmol/L (ref 1.15–1.40)
Calcium, Ion: 1.26 mmol/L (ref 1.15–1.40)
HCT: 30 % — ABNORMAL LOW (ref 36.0–46.0)
HCT: 31 % — ABNORMAL LOW (ref 36.0–46.0)
Hemoglobin: 10.2 g/dL — ABNORMAL LOW (ref 12.0–15.0)
Hemoglobin: 10.5 g/dL — ABNORMAL LOW (ref 12.0–15.0)
O2 Saturation: 99 %
O2 Saturation: 99 %
Potassium: 3.7 mmol/L (ref 3.5–5.1)
Potassium: 3.7 mmol/L (ref 3.5–5.1)
Sodium: 138 mmol/L (ref 135–145)
Sodium: 138 mmol/L (ref 135–145)
TCO2: 25 mmol/L (ref 22–32)
TCO2: 23 mmol/L (ref 22–32)
pCO2 arterial: 44.1 mmHg (ref 32.0–48.0)
pCO2 arterial: 42.1 mmHg (ref 32.0–48.0)
pH, Arterial: 7.337 — ABNORMAL LOW (ref 7.350–7.450)
pH, Arterial: 7.322 — ABNORMAL LOW (ref 7.350–7.450)
pO2, Arterial: 143 mmHg — ABNORMAL HIGH (ref 83.0–108.0)
pO2, Arterial: 148 mmHg — ABNORMAL HIGH (ref 83.0–108.0)

## 2021-05-05 LAB — BASIC METABOLIC PANEL
Anion gap: 9 (ref 5–15)
BUN: 18 mg/dL (ref 8–23)
CO2: 20 mmol/L — ABNORMAL LOW (ref 22–32)
Calcium: 8.9 mg/dL (ref 8.9–10.3)
Chloride: 107 mmol/L (ref 98–111)
Creatinine, Ser: 0.9 mg/dL (ref 0.44–1.00)
GFR, Estimated: >60 mL/min (ref >60)
Glucose, Bld: 179 mg/dL — ABNORMAL HIGH (ref 70–99)
Potassium: 4.2 mmol/L (ref 3.5–5.1)
Sodium: 136 mmol/L (ref 135–145)

## 2021-05-05 LAB — POCT I-STAT, CHEM 8
BUN: 20 mg/dL (ref 8–23)
BUN: 19 mg/dL (ref 8–23)
Calcium, Ion: 1.31 mmol/L (ref 1.15–1.40)
Calcium, Ion: 1.28 mmol/L (ref 1.15–1.40)
Chloride: 106 mmol/L (ref 98–111)
Chloride: 106 mmol/L (ref 98–111)
Creatinine, Ser: 0.8 mg/dL (ref 0.44–1.00)
Creatinine, Ser: 0.8 mg/dL (ref 0.44–1.00)
Glucose, Bld: 127 mg/dL — ABNORMAL HIGH (ref 70–99)
Glucose, Bld: 174 mg/dL — ABNORMAL HIGH (ref 70–99)
HCT: 29 % — ABNORMAL LOW (ref 36.0–46.0)
HCT: 29 % — ABNORMAL LOW (ref 36.0–46.0)
Hemoglobin: 9.9 g/dL — ABNORMAL LOW (ref 12.0–15.0)
Hemoglobin: 9.9 g/dL — ABNORMAL LOW (ref 12.0–15.0)
Potassium: 3.7 mmol/L (ref 3.5–5.1)
Potassium: 3.7 mmol/L (ref 3.5–5.1)
Sodium: 139 mmol/L (ref 135–145)
Sodium: 138 mmol/L (ref 135–145)
TCO2: 23 mmol/L (ref 22–32)
TCO2: 23 mmol/L (ref 22–32)

## 2021-05-05 LAB — GLUCOSE, CAPILLARY
Glucose-Capillary: 94 mg/dL (ref 70–99)
Glucose-Capillary: 168 mg/dL — ABNORMAL HIGH (ref 70–99)

## 2021-05-05 LAB — PREPARE RBC (CROSSMATCH)

## 2021-05-05 SURGERY — ANTERIOR LUMBAR FUSION 1 LEVEL
Anesthesia: General | Site: Spine Lumbar

## 2021-05-05 MED ORDER — ALBUMIN HUMAN 5 % IV SOLN: INTRAVENOUS | Status: DC | PRN

## 2021-05-05 MED ORDER — CEFAZOLIN SODIUM-DEXTROSE 2-4 GM/100ML-% IV SOLN
2.0000 g | Freq: Three times a day (TID) | INTRAVENOUS | Status: AC
Start: 2021-05-05 — End: 2021-05-07
  Administered 2021-05-05 – 2021-05-07 (×6): 2 g via INTRAVENOUS
  Filled 2021-05-05 (×6): qty 100

## 2021-05-05 MED ORDER — CHLORHEXIDINE GLUCONATE CLOTH 2 % EX PADS: 6.0000 | MEDICATED_PAD | Freq: Once | CUTANEOUS | Status: DC

## 2021-05-05 MED ORDER — ONDANSETRON HCL 4 MG PO TABS: 4.0000 mg | ORAL_TABLET | Freq: Four times a day (QID) | ORAL | Status: DC | PRN

## 2021-05-05 MED ORDER — MENTHOL 3 MG MT LOZG: 1.0000 | LOZENGE | OROMUCOSAL | Status: DC | PRN

## 2021-05-05 MED ORDER — ALUM & MAG HYDROXIDE-SIMETH 200-200-20 MG/5ML PO SUSP: 30.0000 mL | Freq: Four times a day (QID) | ORAL | Status: DC | PRN

## 2021-05-05 MED ORDER — HYDROXYZINE HCL 10 MG PO TABS
10.0000 mg | ORAL_TABLET | Freq: Three times a day (TID) | ORAL | Status: DC | PRN
  Filled 2021-05-05: qty 1

## 2021-05-05 MED ORDER — METFORMIN HCL 500 MG PO TABS
500.0000 mg | ORAL_TABLET | Freq: Two times a day (BID) | ORAL | Status: DC
  Administered 2021-05-06 – 2021-05-08 (×3): 500 mg via ORAL
  Filled 2021-05-05 (×4): qty 1

## 2021-05-05 MED ORDER — OXYCODONE HCL 5 MG PO TABS
10.0000 mg | ORAL_TABLET | ORAL | Status: DC | PRN
  Administered 2021-05-05 – 2021-05-09 (×4): 10 mg via ORAL
  Filled 2021-05-05 (×4): qty 2

## 2021-05-05 MED ORDER — SODIUM CHLORIDE 0.9% IV SOLUTION: Freq: Once | INTRAVENOUS | Status: DC

## 2021-05-05 MED ORDER — PROPOFOL 10 MG/ML IV BOLUS
INTRAVENOUS | Status: DC | PRN
  Administered 2021-05-05: 30 mg via INTRAVENOUS
  Administered 2021-05-05: 40 mg via INTRAVENOUS
  Administered 2021-05-05: 100 mg via INTRAVENOUS
  Administered 2021-05-05: 30 mg via INTRAVENOUS

## 2021-05-05 MED ORDER — GLYCOPYRROLATE PF 0.2 MG/ML IJ SOSY
PREFILLED_SYRINGE | INTRAMUSCULAR | Status: DC | PRN
  Administered 2021-05-05 (×2): .2 mg via INTRAVENOUS

## 2021-05-05 MED ORDER — IRBESARTAN 300 MG PO TABS
300.0000 mg | ORAL_TABLET | Freq: Every day | ORAL | Status: DC
  Administered 2021-05-06: 300 mg via ORAL
  Filled 2021-05-05: qty 1

## 2021-05-05 MED ORDER — AMISULPRIDE (ANTIEMETIC) 5 MG/2ML IV SOLN: 10.0000 mg | Freq: Once | INTRAVENOUS | Status: DC | PRN

## 2021-05-05 MED ORDER — LIDOCAINE-EPINEPHRINE 1 %-1:100000 IJ SOLN
INTRAMUSCULAR | Status: DC | PRN
  Administered 2021-05-05 (×2): 10 mL

## 2021-05-05 MED ORDER — ORAL CARE MOUTH RINSE: 15.0000 mL | Freq: Once | OROMUCOSAL | Status: AC

## 2021-05-05 MED ORDER — CHLORHEXIDINE GLUCONATE 0.12 % MT SOLN
15.0000 mL | Freq: Once | OROMUCOSAL | Status: AC
  Administered 2021-05-05: 15 mL via OROMUCOSAL
  Filled 2021-05-05: qty 15

## 2021-05-05 MED ORDER — LACTATED RINGERS IV SOLN: INTRAVENOUS | Status: DC

## 2021-05-05 MED ORDER — FENTANYL CITRATE (PF) 250 MCG/5ML IJ SOLN
INTRAMUSCULAR | Status: AC
  Filled 2021-05-05: qty 5

## 2021-05-05 MED ORDER — ACETAMINOPHEN 325 MG PO TABS: 325.0000 mg | ORAL_TABLET | Freq: Once | ORAL | Status: DC | PRN

## 2021-05-05 MED ORDER — ONDANSETRON HCL 4 MG/2ML IJ SOLN: 4.0000 mg | Freq: Four times a day (QID) | INTRAMUSCULAR | Status: DC | PRN

## 2021-05-05 MED ORDER — HYDROMORPHONE HCL 1 MG/ML IJ SOLN
INTRAMUSCULAR | Status: AC
  Filled 2021-05-05: qty 1

## 2021-05-05 MED ORDER — SODIUM CHLORIDE 0.9% FLUSH: 3.0000 mL | INTRAVENOUS | Status: DC | PRN

## 2021-05-05 MED ORDER — ACETAMINOPHEN 10 MG/ML IV SOLN
INTRAVENOUS | Status: AC
  Filled 2021-05-05: qty 100

## 2021-05-05 MED ORDER — PROPOFOL 1000 MG/100ML IV EMUL
INTRAVENOUS | Status: AC
  Filled 2021-05-05: qty 100

## 2021-05-05 MED ORDER — PRAVASTATIN SODIUM 10 MG PO TABS
10.0000 mg | ORAL_TABLET | Freq: Every day | ORAL | Status: DC
  Administered 2021-05-05 – 2021-05-09 (×5): 10 mg via ORAL
  Filled 2021-05-05 (×5): qty 1

## 2021-05-05 MED ORDER — ROCURONIUM BROMIDE 100 MG/10ML IV SOLN
INTRAVENOUS | Status: DC | PRN
  Administered 2021-05-05: 50 mg via INTRAVENOUS

## 2021-05-05 MED ORDER — GABAPENTIN 100 MG PO CAPS
100.0000 mg | ORAL_CAPSULE | Freq: Three times a day (TID) | ORAL | Status: DC
  Administered 2021-05-05 – 2021-05-09 (×11): 100 mg via ORAL
  Filled 2021-05-05 (×11): qty 1

## 2021-05-05 MED ORDER — CEFAZOLIN SODIUM-DEXTROSE 2-4 GM/100ML-% IV SOLN
2.0000 g | INTRAVENOUS | Status: AC
  Administered 2021-05-05 (×2): 2 g via INTRAVENOUS
  Filled 2021-05-05: qty 100

## 2021-05-05 MED ORDER — ESTRADIOL 10 MCG VA TABS
1.0000 | ORAL_TABLET | VAGINAL | Status: DC
  Filled 2021-05-05: qty 1

## 2021-05-05 MED ORDER — GLYCOPYRROLATE PF 0.2 MG/ML IJ SOSY
PREFILLED_SYRINGE | INTRAMUSCULAR | Status: AC
  Filled 2021-05-05: qty 1

## 2021-05-05 MED ORDER — POTASSIUM CHLORIDE CRYS ER 20 MEQ PO TBCR
20.0000 meq | EXTENDED_RELEASE_TABLET | Freq: Two times a day (BID) | ORAL | Status: DC
  Administered 2021-05-05 – 2021-05-09 (×8): 20 meq via ORAL
  Filled 2021-05-05 (×8): qty 1

## 2021-05-05 MED ORDER — BUTALBITAL-APAP-CAFFEINE 50-325-40 MG PO TABS
1.0000 | ORAL_TABLET | ORAL | Status: DC | PRN
  Filled 2021-05-05: qty 1

## 2021-05-05 MED ORDER — PROPOFOL 10 MG/ML IV BOLUS
INTRAVENOUS | Status: AC
  Filled 2021-05-05: qty 20

## 2021-05-05 MED ORDER — AZELASTINE HCL 0.1 % NA SOLN
2.0000 | Freq: Two times a day (BID) | NASAL | Status: DC | PRN
  Filled 2021-05-05: qty 30

## 2021-05-05 MED ORDER — FENTANYL CITRATE (PF) 100 MCG/2ML IJ SOLN
25.0000 ug | INTRAMUSCULAR | Status: DC | PRN
  Administered 2021-05-05 (×3): 50 ug via INTRAVENOUS
  Administered 2021-05-05: 25 ug via INTRAVENOUS

## 2021-05-05 MED ORDER — OXYCODONE-ACETAMINOPHEN 5-325 MG PO TABS
2.0000 | ORAL_TABLET | ORAL | Status: DC | PRN
  Administered 2021-05-06 – 2021-05-08 (×3): 2 via ORAL
  Filled 2021-05-05 (×3): qty 2

## 2021-05-05 MED ORDER — HYDROCHLOROTHIAZIDE 25 MG PO TABS
25.0000 mg | ORAL_TABLET | Freq: Every day | ORAL | Status: DC
  Administered 2021-05-06: 25 mg via ORAL
  Filled 2021-05-05: qty 1

## 2021-05-05 MED ORDER — MIDAZOLAM HCL 5 MG/5ML IJ SOLN
INTRAMUSCULAR | Status: DC | PRN
  Administered 2021-05-05 (×2): 1 mg via INTRAVENOUS

## 2021-05-05 MED ORDER — ACETAMINOPHEN 10 MG/ML IV SOLN: 1000.0000 mg | Freq: Once | INTRAVENOUS | Status: DC | PRN

## 2021-05-05 MED ORDER — LIDOCAINE 2% (20 MG/ML) 5 ML SYRINGE
INTRAMUSCULAR | Status: AC
  Filled 2021-05-05: qty 5

## 2021-05-05 MED ORDER — VITAMIN D (ERGOCALCIFEROL) 1.25 MG (50000 UNIT) PO CAPS
50000.0000 [IU] | ORAL_CAPSULE | ORAL | Status: DC
  Administered 2021-05-05: 50000 [IU] via ORAL
  Filled 2021-05-05: qty 1

## 2021-05-05 MED ORDER — ASPIRIN EC 81 MG PO TBEC
81.0000 mg | DELAYED_RELEASE_TABLET | Freq: Every day | ORAL | Status: DC
  Administered 2021-05-05 – 2021-05-09 (×5): 81 mg via ORAL
  Filled 2021-05-05 (×5): qty 1

## 2021-05-05 MED ORDER — FLUTICASONE PROPIONATE 50 MCG/ACT NA SUSP
1.0000 | Freq: Every day | NASAL | Status: DC | PRN
  Filled 2021-05-05: qty 16

## 2021-05-05 MED ORDER — CYCLOBENZAPRINE HCL 10 MG PO TABS
10.0000 mg | ORAL_TABLET | Freq: Three times a day (TID) | ORAL | Status: DC | PRN
Start: 2021-05-05 — End: 2021-05-07
  Administered 2021-05-05: 10 mg via ORAL
  Filled 2021-05-05: qty 1

## 2021-05-05 MED ORDER — FENTANYL CITRATE (PF) 100 MCG/2ML IJ SOLN
INTRAMUSCULAR | Status: AC
  Filled 2021-05-05: qty 2

## 2021-05-05 MED ORDER — VENLAFAXINE HCL ER 37.5 MG PO CP24
37.5000 mg | ORAL_CAPSULE | Freq: Every day | ORAL | Status: DC
  Administered 2021-05-06 – 2021-05-09 (×4): 37.5 mg via ORAL
  Filled 2021-05-05 (×4): qty 1

## 2021-05-05 MED ORDER — LINAGLIPTIN 5 MG PO TABS
5.0000 mg | ORAL_TABLET | Freq: Every day | ORAL | Status: DC
  Administered 2021-05-05 – 2021-05-09 (×5): 5 mg via ORAL
  Filled 2021-05-05 (×5): qty 1

## 2021-05-05 MED ORDER — PHENYLEPHRINE 40 MCG/ML (10ML) SYRINGE FOR IV PUSH (FOR BLOOD PRESSURE SUPPORT)
PREFILLED_SYRINGE | INTRAVENOUS | Status: AC
  Filled 2021-05-05: qty 10

## 2021-05-05 MED ORDER — INSULIN ASPART 100 UNIT/ML IJ SOLN: 0.0000 [IU] | Freq: Three times a day (TID) | INTRAMUSCULAR | Status: DC

## 2021-05-05 MED ORDER — ACETAMINOPHEN 325 MG PO TABS: 650.0000 mg | ORAL_TABLET | ORAL | Status: DC | PRN

## 2021-05-05 MED ORDER — ACETAMINOPHEN 500 MG PO TABS
1000.0000 mg | ORAL_TABLET | ORAL | Status: DC | PRN
  Administered 2021-05-06 – 2021-05-07 (×3): 1000 mg via ORAL
  Filled 2021-05-05 (×3): qty 2

## 2021-05-05 MED ORDER — ACETAMINOPHEN 650 MG RE SUPP: 650.0000 mg | RECTAL | Status: DC | PRN

## 2021-05-05 MED ORDER — PROMETHAZINE HCL 25 MG/ML IJ SOLN: 6.2500 mg | INTRAMUSCULAR | Status: DC | PRN

## 2021-05-05 MED ORDER — PHENOL 1.4 % MT LIQD: 1.0000 | OROMUCOSAL | Status: DC | PRN

## 2021-05-05 MED ORDER — GLIPIZIDE ER 5 MG PO TB24
5.0000 mg | ORAL_TABLET | Freq: Two times a day (BID) | ORAL | Status: DC
  Administered 2021-05-05 – 2021-05-09 (×8): 5 mg via ORAL
  Filled 2021-05-05 (×9): qty 1

## 2021-05-05 MED ORDER — ROCURONIUM BROMIDE 10 MG/ML (PF) SYRINGE
PREFILLED_SYRINGE | INTRAVENOUS | Status: AC
  Filled 2021-05-05: qty 10

## 2021-05-05 MED ORDER — PROPOFOL 500 MG/50ML IV EMUL
INTRAVENOUS | Status: DC | PRN
  Administered 2021-05-05: 20 ug/kg/min via INTRAVENOUS

## 2021-05-05 MED ORDER — OLMESARTAN MEDOXOMIL-HCTZ 40-25 MG PO TABS: 1.0000 | ORAL_TABLET | Freq: Every day | ORAL | Status: DC

## 2021-05-05 MED ORDER — FENTANYL CITRATE (PF) 100 MCG/2ML IJ SOLN
INTRAMUSCULAR | Status: DC | PRN
  Administered 2021-05-05: 25 ug via INTRAVENOUS
  Administered 2021-05-05 (×2): 50 ug via INTRAVENOUS
  Administered 2021-05-05: 25 ug via INTRAVENOUS
  Administered 2021-05-05: 50 ug via INTRAVENOUS
  Administered 2021-05-05: 25 ug via INTRAVENOUS
  Administered 2021-05-05: 50 ug via INTRAVENOUS
  Administered 2021-05-05: 25 ug via INTRAVENOUS
  Administered 2021-05-05 (×2): 50 ug via INTRAVENOUS

## 2021-05-05 MED ORDER — TRAMADOL HCL 50 MG PO TABS: 50.0000 mg | ORAL_TABLET | Freq: Four times a day (QID) | ORAL | Status: DC | PRN

## 2021-05-05 MED ORDER — PHENYLEPHRINE HCL-NACL 10-0.9 MG/250ML-% IV SOLN
INTRAVENOUS | Status: AC
  Filled 2021-05-05: qty 250

## 2021-05-05 MED ORDER — DEXAMETHASONE SODIUM PHOSPHATE 10 MG/ML IJ SOLN
10.0000 mg | Freq: Once | INTRAMUSCULAR | Status: AC
  Administered 2021-05-05: 10 mg via INTRAVENOUS
  Filled 2021-05-05: qty 1

## 2021-05-05 MED ORDER — MIDAZOLAM HCL 2 MG/2ML IJ SOLN
INTRAMUSCULAR | Status: AC
  Filled 2021-05-05: qty 2

## 2021-05-05 MED ORDER — PHENYLEPHRINE HCL-NACL 10-0.9 MG/250ML-% IV SOLN
INTRAVENOUS | Status: DC | PRN
  Administered 2021-05-05: 20 ug/min via INTRAVENOUS

## 2021-05-05 MED ORDER — BUPIVACAINE LIPOSOME 1.3 % IJ SUSP
INTRAMUSCULAR | Status: AC
  Filled 2021-05-05: qty 20

## 2021-05-05 MED ORDER — 0.9 % SODIUM CHLORIDE (POUR BTL) OPTIME
TOPICAL | Status: DC | PRN
  Administered 2021-05-05 (×2): 1000 mL

## 2021-05-05 MED ORDER — THROMBIN 5000 UNITS EX SOLR: OROMUCOSAL | Status: DC | PRN

## 2021-05-05 MED ORDER — LACTATED RINGERS IV SOLN: INTRAVENOUS | Status: DC | PRN

## 2021-05-05 MED ORDER — LIDOCAINE-EPINEPHRINE 1 %-1:100000 IJ SOLN
INTRAMUSCULAR | Status: AC
  Filled 2021-05-05: qty 1

## 2021-05-05 MED ORDER — DEXAMETHASONE SODIUM PHOSPHATE 10 MG/ML IJ SOLN
INTRAMUSCULAR | Status: AC
  Filled 2021-05-05: qty 1

## 2021-05-05 MED ORDER — PRAMIPEXOLE DIHYDROCHLORIDE 1 MG PO TABS
1.0000 mg | ORAL_TABLET | Freq: Every day | ORAL | Status: DC
  Administered 2021-05-05 – 2021-05-08 (×4): 1 mg via ORAL
  Filled 2021-05-05 (×5): qty 1

## 2021-05-05 MED ORDER — SODIUM CHLORIDE 0.9 % IV SOLN
250.0000 mL | INTRAVENOUS | Status: DC
  Administered 2021-05-06: 250 mL via INTRAVENOUS

## 2021-05-05 MED ORDER — ZOLPIDEM TARTRATE 5 MG PO TABS: 5.0000 mg | ORAL_TABLET | Freq: Every evening | ORAL | Status: DC | PRN

## 2021-05-05 MED ORDER — MECLIZINE HCL 25 MG PO TABS: 25.0000 mg | ORAL_TABLET | Freq: Four times a day (QID) | ORAL | Status: DC | PRN

## 2021-05-05 MED ORDER — ONDANSETRON HCL 4 MG/2ML IJ SOLN
INTRAMUSCULAR | Status: AC
  Filled 2021-05-05: qty 2

## 2021-05-05 MED ORDER — THROMBIN 20000 UNITS EX SOLR
CUTANEOUS | Status: AC
  Filled 2021-05-05: qty 20000

## 2021-05-05 MED ORDER — ACETAMINOPHEN 160 MG/5ML PO SOLN: 325.0000 mg | Freq: Once | ORAL | Status: DC | PRN

## 2021-05-05 MED ORDER — MEPERIDINE HCL 25 MG/ML IJ SOLN: 6.2500 mg | INTRAMUSCULAR | Status: DC | PRN

## 2021-05-05 MED ORDER — PANTOPRAZOLE SODIUM 40 MG PO TBEC
40.0000 mg | DELAYED_RELEASE_TABLET | Freq: Every day | ORAL | Status: DC
  Administered 2021-05-05 – 2021-05-09 (×5): 40 mg via ORAL
  Filled 2021-05-05 (×5): qty 1

## 2021-05-05 MED ORDER — THROMBIN 5000 UNITS EX SOLR
CUTANEOUS | Status: AC
  Filled 2021-05-05: qty 5000

## 2021-05-05 MED ORDER — FUROSEMIDE 40 MG PO TABS: 40.0000 mg | ORAL_TABLET | Freq: Every day | ORAL | Status: DC | PRN

## 2021-05-05 MED ORDER — HYDROMORPHONE HCL 1 MG/ML IJ SOLN
0.5000 mg | INTRAMUSCULAR | Status: DC | PRN
  Administered 2021-05-05 – 2021-05-06 (×3): 0.5 mg via INTRAVENOUS
  Filled 2021-05-05 (×2): qty 1

## 2021-05-05 MED ORDER — SODIUM CHLORIDE 0.9% FLUSH
3.0000 mL | Freq: Two times a day (BID) | INTRAVENOUS | Status: DC
  Administered 2021-05-06 – 2021-05-09 (×7): 3 mL via INTRAVENOUS

## 2021-05-05 MED ORDER — SUGAMMADEX SODIUM 200 MG/2ML IV SOLN
INTRAVENOUS | Status: DC | PRN
  Administered 2021-05-05: 50 mg via INTRAVENOUS

## 2021-05-05 MED ORDER — LIDOCAINE 2% (20 MG/ML) 5 ML SYRINGE
INTRAMUSCULAR | Status: DC | PRN
  Administered 2021-05-05: 100 mg via INTRAVENOUS

## 2021-05-05 MED ORDER — CEFAZOLIN SODIUM 1 G IJ SOLR
INTRAMUSCULAR | Status: AC
  Filled 2021-05-05: qty 20

## 2021-05-05 MED ORDER — BUPIVACAINE LIPOSOME 1.3 % IJ SUSP
INTRAMUSCULAR | Status: DC | PRN
  Administered 2021-05-05: 20 mL

## 2021-05-05 MED ORDER — PANTOPRAZOLE SODIUM 40 MG IV SOLR: 40.0000 mg | Freq: Every day | INTRAVENOUS | Status: DC

## 2021-05-05 MED ORDER — ACETAMINOPHEN 10 MG/ML IV SOLN
INTRAVENOUS | Status: DC | PRN
  Administered 2021-05-05: 1000 mg via INTRAVENOUS

## 2021-05-05 MED ORDER — THROMBIN 20000 UNITS EX SOLR: CUTANEOUS | Status: DC | PRN

## 2021-05-05 SURGICAL SUPPLY — 84 items
ADH SKN CLS APL DERMABOND .7 (GAUZE/BANDAGES/DRESSINGS) ×8
ANCH SPNL 25 LMBR MIS (Anchor) ×12 IMPLANT
ANCHOR LUMBAR 25 MIS (Anchor) ×15 IMPLANT
APPLIER CLIP 11 MED OPEN (CLIP) ×10
APR CLP MED 11 20 MLT OPN (CLIP) ×8
BAG COUNTER SPONGE SURGICOUNT (BAG) ×30 IMPLANT
BAG SPNG CNTER NS LX DISP (BAG) ×24
BONE MATRIX OSTEOCEL PRO MED (Bone Implant) ×10 IMPLANT
BONE VIVIGEN FORMABLE 10CC (Bone Implant) ×5 IMPLANT
BUR MATCHSTICK NEURO 3.0 LAGG (BURR) ×5 IMPLANT
CAGE CORVENT EX 8X18X40 (Cage) ×5 IMPLANT
CANISTER SUCT 3000ML PPV (MISCELLANEOUS) ×10 IMPLANT
CAP LOCKING THREADED (Cap) ×60 IMPLANT
CARTRIDGE OIL MAESTRO DRILL (MISCELLANEOUS) ×4 IMPLANT
CLIP APPLIE 11 MED OPEN (CLIP) ×8 IMPLANT
CNTNR URN SCR LID CUP LEK RST (MISCELLANEOUS) ×8 IMPLANT
CONN CREO THRD 27 F/5.5 ROD (Connector) ×10 IMPLANT
CONNECTOR CRE THRD 27F/5.5 ROD (Connector) ×8 IMPLANT
CONT SPEC 4OZ STRL OR WHT (MISCELLANEOUS) ×10
COVER BACK TABLE 60X90IN (DRAPES) ×5 IMPLANT
DERMABOND ADVANCED (GAUZE/BANDAGES/DRESSINGS) ×2
DERMABOND ADVANCED .7 DNX12 (GAUZE/BANDAGES/DRESSINGS) ×8 IMPLANT
DIFFUSER DRILL AIR PNEUMATIC (MISCELLANEOUS) ×5 IMPLANT
DRAPE C-ARM 42X72 X-RAY (DRAPES) ×10 IMPLANT
DRAPE C-ARMOR (DRAPES) ×10 IMPLANT
DRAPE LAPAROTOMY 100X72X124 (DRAPES) ×15 IMPLANT
DRAPE SCAN PATIENT (DRAPES) ×15 IMPLANT
DRAPE SURG 17X23 STRL (DRAPES) ×20 IMPLANT
DRSG OPSITE 4X5.5 SM (GAUZE/BANDAGES/DRESSINGS) ×5 IMPLANT
DRSG OPSITE POSTOP 3X4 (GAUZE/BANDAGES/DRESSINGS) ×10 IMPLANT
DRSG OPSITE POSTOP 4X10 (GAUZE/BANDAGES/DRESSINGS) ×5 IMPLANT
DRSG OPSITE POSTOP 4X8 (GAUZE/BANDAGES/DRESSINGS) ×5 IMPLANT
ELECT BLADE 4.0 EZ CLEAN MEGAD (MISCELLANEOUS) ×5
ELECT REM PT RETURN 9FT ADLT (ELECTROSURGICAL) ×15
ELECTRODE BLDE 4.0 EZ CLN MEGD (MISCELLANEOUS) ×4 IMPLANT
ELECTRODE REM PT RTRN 9FT ADLT (ELECTROSURGICAL) ×12 IMPLANT
EVACUATOR 1/8 PVC DRAIN (DRAIN) IMPLANT
GAUZE 4X4 16PLY ~~LOC~~+RFID DBL (SPONGE) ×20 IMPLANT
GAUZE SPONGE 4X4 12PLY STRL (GAUZE/BANDAGES/DRESSINGS) ×5 IMPLANT
GLOVE SURG ENC MOIS LTX SZ7 (GLOVE) ×20 IMPLANT
GLOVE SURG ENC MOIS LTX SZ8 (GLOVE) ×20 IMPLANT
GLOVE SURG UNDER LTX SZ8.5 (GLOVE) ×20 IMPLANT
GLOVE SURG UNDER POLY LF SZ7 (GLOVE) ×20 IMPLANT
GOWN STRL REUS W/ TWL LRG LVL3 (GOWN DISPOSABLE) ×16 IMPLANT
GOWN STRL REUS W/ TWL XL LVL3 (GOWN DISPOSABLE) ×16 IMPLANT
GOWN STRL REUS W/TWL LRG LVL3 (GOWN DISPOSABLE) ×20
GOWN STRL REUS W/TWL XL LVL3 (GOWN DISPOSABLE) ×20
GRAFT BONE PROTEIOS LRG 5CC (Orthopedic Implant) ×5 IMPLANT
HEMOSTAT POWDER KIT SURGIFOAM (HEMOSTASIS) ×5 IMPLANT
KIT BASIN OR (CUSTOM PROCEDURE TRAY) ×5 IMPLANT
KIT DILATOR XLIF 5 (KITS) ×4 IMPLANT
KIT SURGICAL ACCESS MAXCESS 4 (KITS) ×5 IMPLANT
KIT TURNOVER KIT B (KITS) ×10 IMPLANT
KIT XLIF (KITS) ×1
MARKER SPHERE PSV REFLC 13MM (MARKER) ×25 IMPLANT
MODULE NVM5 NEXT GEN EMG (NEEDLE) ×5 IMPLANT
NEEDLE HYPO 21X1.5 SAFETY (NEEDLE) ×5 IMPLANT
NEEDLE HYPO 22GX1.5 SAFETY (NEEDLE) ×10 IMPLANT
NEEDLE SPNL 18GX3.5 QUINCKE PK (NEEDLE) ×5 IMPLANT
NS IRRIG 1000ML POUR BTL (IV SOLUTION) ×10 IMPLANT
OIL CARTRIDGE MAESTRO DRILL (MISCELLANEOUS) ×5
PACK LAMINECTOMY NEURO (CUSTOM PROCEDURE TRAY) ×15 IMPLANT
ROD 75MM SPINAL (Rod) ×10 IMPLANT
ROD CREO 60MM (Rod) ×10 IMPLANT
SCREW CREO 5.5X40 (Screw) ×10 IMPLANT
SCREW CREO 6.5X40 (Screw) ×10 IMPLANT
SCREW CREO THREADED 8.5X75 (Screw) ×10 IMPLANT
SPACER HEDRON IA 26X34X11 15D (Spacer) ×5 IMPLANT
SPONGE INTESTINAL PEANUT (DISPOSABLE) ×20 IMPLANT
SPONGE SURGIFOAM ABS GEL 100 (HEMOSTASIS) ×5 IMPLANT
SPONGE T-LAP 18X18 ~~LOC~~+RFID (SPONGE) ×10 IMPLANT
SPONGE T-LAP 4X18 ~~LOC~~+RFID (SPONGE) ×15 IMPLANT
SUT PDS AB 1 CTX 36 (SUTURE) ×5 IMPLANT
SUT SILK 3 0 TIES 17X18 (SUTURE) ×5
SUT SILK 3-0 18XBRD TIE BLK (SUTURE) ×4 IMPLANT
SUT VIC AB 0 CT1 18XCR BRD8 (SUTURE) ×4 IMPLANT
SUT VIC AB 0 CT1 8-18 (SUTURE) ×5
SUT VIC AB 2-0 CT1 18 (SUTURE) ×10 IMPLANT
SUT VIC AB 4-0 PS2 27 (SUTURE) ×20 IMPLANT
SYR 20ML LL LF (SYRINGE) ×5 IMPLANT
TOWEL GREEN STERILE (TOWEL DISPOSABLE) ×10 IMPLANT
TOWEL GREEN STERILE FF (TOWEL DISPOSABLE) ×10 IMPLANT
TRAY FOLEY MTR SLVR 16FR STAT (SET/KITS/TRAYS/PACK) ×5 IMPLANT
WATER STERILE IRR 1000ML POUR (IV SOLUTION) ×5 IMPLANT

## 2021-05-05 NOTE — Anesthesia Procedure Notes (Signed)
Procedure Name: Intubation Date/Time: 05/05/2021 8:05 AM Performed by: Donnelly Angelica, RN Pre-anesthesia Checklist: Patient identified, Emergency Drugs available, Suction available and Patient being monitored Patient Re-evaluated:Patient Re-evaluated prior to induction Oxygen Delivery Method: Circle System Utilized Preoxygenation: Pre-oxygenation with 100% oxygen Induction Type: IV induction Ventilation: Mask ventilation without difficulty Laryngoscope Size: Mac and 3 Grade View: Grade II Tube type: Oral Tube size: 7.0 mm Number of attempts: 1 Airway Equipment and Method: Stylet and Oral airway Placement Confirmation: ETT inserted through vocal cords under direct vision, positive ETCO2 and breath sounds checked- equal and bilateral Secured at: 22 cm Tube secured with: Tape Dental Injury: Teeth and Oropharynx as per pre-operative assessment

## 2021-05-05 NOTE — Anesthesia Procedure Notes (Addendum)
Arterial Line Insertion Start/End7/18/2022 7:23 AM, 05/05/2021 7:26 AM Performed by: Shelton Silvas, MD, anesthesiologist  Patient location: Pre-op. Preanesthetic checklist: patient identified, IV checked, site marked, risks and benefits discussed, surgical consent, monitors and equipment checked, pre-op evaluation, timeout performed and anesthesia consent Lidocaine 1% used for infiltration Right, radial was placed Catheter size: 20 Fr Hand hygiene performed  and maximum sterile barriers used   Attempts: 1 Procedure performed without using ultrasound guided technique. Ultrasound Notes:anatomy identified, needle tip was noted to be adjacent to the nerve/plexus identified and no ultrasound evidence of intravascular and/or intraneural injection Following insertion, dressing applied and Biopatch. Post procedure assessment: normal and unchanged  Post procedure complications: second provider assisted. Patient tolerated the procedure well with no immediate complications.

## 2021-05-05 NOTE — Progress Notes (Signed)
Patient ID: Tammy Hicks, female   DOB: 15-Nov-1945, 75 y.o.   MRN: [id number] Patient has predominantly left leg pain and during review of imaging during the different stages of the procedure noticed again that she has a left-sided L5-S1 herniated nucleus pulposus.  The consent did not have L5-S1 microdiscectomy on it however I do think it is a significant component of her preoperative symptoms.  So I discussed this with her husband and I have added this to the consent as well as reviewed the risks and benefits of the operation with him as well as perioperative course expectations of outcome and alternatives of surgery and he understood and agreed to let us add this on.  So I have initialed the consent appropriately

## 2021-05-05 NOTE — Op Note (Signed)
Date: May 05, 2021  Preoperative diagnosis: Chronic lower back pain  Postoperative diagnosis: Same  Procedure: Anterior spine exposure of the L5-S1 disc space via anterior retroperitoneal approach for L5-S1 ALIF  Surgeon: Dr. Cephus Shelling, MD  Co-surgeon: Dr. Donalee Citrin, MD  Indications: 75 year old female with history of chronic lower back pain that has failed conservative management.  She has been evaluated by Dr. Wynetta Emery with neurosurgery and he recommended multilevel approach including L5-S1 ALIF.  Vascular surgery was asked to assist with anterior abdominal exposure.  She presents after risk benefits discussed.  Findings: Transverse incision made over the left rectus muscle where the L5-S1 disc space was marked.  I entered lateral to the muscle after the fascia was opened in the retroperitoneum and mobilized the peritoneum and left ureter across midline.  Ultimately the middle sacral vessels were divided between clips.  I mobilized on each side of the disc base where it was fairly adherent and she did have scar tissue in the retroperitoneum.  Ultimately fixed retractors were placed and we confirmed we were at the correct L5-S1 level on lateral fluoroscopy with a needle in the disc space.  Anesthesia: General  Details: Patient was taken to the operating room after informed consent was obtained.  Placed on the operative table in the supine position.  General endotracheal anesthesia was induced.  Fluoroscopic C-arm was then used in the lateral position to mark the L5-S1 disc space that was marked over the left rectus muscle.  The abdominal wall was then prepped and draped in usual sterile fashion.  Timeout was performed antibiotics were given.  Initially made a transverse incision over the left rectus muscle where a mark was placed.  Dissected through the subcutaneous tissue with Bovie cautery and used cerebellar retractors for added visualization.  I then opened the anterior rectus sheath  transversely with Bovie cautery.  Hemostats were then used to raise flaps under the anterior rectus sheath.  The rectus muscle was mobilized to the midline and I entered the retroperitoneum lateral to the muscle.  The peritoneum and left ureter were identified and then mobilized across midline.  Dr. Wynetta Emery used hand-held Wiley retractors to pull the peritoneum and left ureter to the midline while we continued to mobilize the peritoneum and left ureter across midline with KD and suction.  Balfour retractor was placed in the wound for added visualization.  Ultimately identified the middle sacral vessels and these were divided between clips.  I then mobilized on each side of the disc space where it was severely adherent tissue in order to get working room on each side of the disc space.  We placed 150 reverse lips on each side of the disc space with 200 malleable retractors cranial caudal.  A fixed Thompson retractor was placed.  Spinal needle was placed in the disc base and we confirmed we were at the correct level at L5-S1.  Case was turned over to Dr. Wynetta Emery  Complication: None  Condition: Stable  Cephus Shelling, MD [facility] Office: [phone]   Cephus Shelling

## 2021-05-05 NOTE — H&P (Signed)
History and Physical Interval Note:  05/05/2021 7:25 AM  Tammy Hicks  has presented today for surgery, with the diagnosis of Lumbago.  The various methods of treatment have been discussed with the patient and family. After consideration of risks, benefits and other options for treatment, the patient has consented to  Procedure(s): ALIF - L5-S1 with posterior augmentation with pedicle screws and iliac screws (N/A) AIRO stereotactic navigation (N/A) XLIF - L1-L2 - Interbody Fusion with extension of fusion to L1 with cortical screws (N/A) LUMBAR PERCUTANEOUS PEDICLE SCREW 1 LEVEL (N/A) ABDOMINAL EXPOSURE (N/A) as a surgical intervention.  The patient's history has been reviewed, patient examined, no change in status, stable for surgery.  I have reviewed the patient's chart and labs.  Questions were answered to the patient's satisfaction.    L5-S1 anterior abdominal exposure  Tammy Hicks  Patient name: Tammy Hicks MRN: [id number]        DOB: December 13, 1945            Sex: female   REASON FOR CONSULT: Evaluate for abdominal exposure for L5-S1 ALIF   HPI: Tammy Hicks is a 75 y.o. female, with history of hypertension, hyperlipidemia, diabetes and chronic lower back pain that presents for evaluation of L5-S1 ALIF.  Patient has been under the care of Dr. Saintclair Halsted and he asked vascular surgery to evaluate for abdominal approach for L5-S1 ALIF.  Patient states her current episode of back pain started in 2020.  She has failed physical therapy and injections.  She has had previous L2-L3 laminectomy with posterior screws at L2-L3.  Her abdominal surgery includes a abdominal hysterectomy and a C-section.  She has had a CT lumbar spine from 02/02/2021.       Past Medical History:  Diagnosis Date   Anxiety     Depression     Diabetes mellitus without complication ([place])     Family history of adverse reaction to anesthesia      "my sister gets really sick afterwards"   GERD (gastroesophageal reflux  disease)     Headache      migraines   History of kidney stones     Hyperlipidemia     Hypertension     Insomnia     Osteoarthritis     PONV (postoperative nausea and vomiting)      "progresses to migraine"   Restless leg syndrome     Sleep apnea      uses CPAP           Past Surgical History:  Procedure Laterality Date   ABDOMINAL HYSTERECTOMY       BACK SURGERY        x2   CESAREAN SECTION       COLONOSCOPY        x2   LITHOTRIPSY        "several"   TONSILLECTOMY          History reviewed. No pertinent family history.   SOCIAL HISTORY: Social History         Socioeconomic History   Marital status: Married      Spouse name: Not on file   Number of children: Not on file   Years of education: Not on file   Highest education level: Not on file  Occupational History   Not on file  Tobacco Use   Smoking status: Never   Smokeless tobacco: Never  Vaping Use   Vaping Use: Never used  Substance and Sexual Activity  Alcohol use: No   Drug use: No   Sexual activity: Not on file  Other Topics Concern   Not on file  Social History Narrative   Not on file    Social Determinants of Health    Financial Resource Strain: Not on file  Food Insecurity: Not on file  Transportation Needs: Not on file  Physical Activity: Not on file  Stress: Not on file  Social Connections: Not on file  Intimate Partner Violence: Not on file           Allergies  Allergen Reactions   Pioglitazone Swelling and Other (See Comments)      Leg swelling Fatigue AKA: ACTOS Leg swelling   Primidone Palpitations, Shortness Of Breath and Other (See Comments)      DYSPNEA   Metformin Swelling   Metformin And Related Swelling   Codeine Nausea Only and Nausea And Vomiting      Severe nausea             Current Outpatient Medications  Medication Sig Dispense Refill   acetaminophen (TYLENOL) 325 MG tablet Take 1 tablet by mouth as needed.       ACETAMINOPHEN-BUTALBITAL 50-325 MG  TABS Take by mouth.       aspirin 81 MG EC tablet Take by mouth.       azelastine (ASTELIN) 0.1 % nasal spray Place into the nose.       Blood Glucose Monitoring Suppl (ONE TOUCH ULTRA 2) w/Device KIT Use as instructed. Check blood sugar 2 times per day. Dx. E11.9       clotrimazole-betamethasone (LOTRISONE) cream Apply to affected area 2 times daily prn       desvenlafaxine (PRISTIQ) 50 MG 24 hr tablet Take 1 tablet by mouth daily.       ergocalciferol (VITAMIN D2) 1.25 MG (50000 UT) capsule Take 1 capsule by mouth once a week.       fluticasone (FLONASE) 50 MCG/ACT nasal spray 1 spray by Each Nare route daily.       furosemide (LASIX) 40 MG tablet Take 40 mg by mouth daily as needed for fluid.       gabapentin (NEURONTIN) 300 MG capsule TAKE 1 CAPSULE BY MOUTH IN  THE MORNING AND IN THE  AFTERNOON AND TAKE 2  CAPSULES BY MOUTH AT  BEDTIME       glipiZIDE (GLUCOTROL XL) 10 MG 24 hr tablet Take 10 mg by mouth 2 (two) times daily.       glipiZIDE (GLUCOTROL XL) 5 MG 24 hr tablet Take 5 mg by mouth 2 (two) times daily.       HYDROcodone-acetaminophen (NORCO) 10-325 MG tablet Take by mouth.       hydrOXYzine (ATARAX/VISTARIL) 25 MG tablet         insulin regular (NOVOLIN R) 100 units/mL injection ADMINISTER 10 UNITS UNDER THE SKIN THREE TIMES DAILY       Insulin Syringe-Needle U-100 (B-D INS SYR ULTRAFINE 1CC/30G) 30G X 1/2" 1 ML MISC See admin instructions.       Insulin Syringe-Needle U-100 25G X 1" 1 ML MISC Use 3 times a day as needed       meclizine (ANTIVERT) 25 MG tablet Take 25 mg by mouth every 6 (six) hours as needed for dizziness.       metFORMIN (GLUCOPHAGE) 500 MG tablet         olmesartan-hydrochlorothiazide (BENICAR HCT) 40-25 MG tablet Take 1 tablet by mouth daily.  omeprazole (PRILOSEC) 20 MG capsule Take 1 capsule by mouth daily.       OneTouch Delica Lancets 36U MISC 1 each by Other route 2 times daily. Dx E11.9       oxyCODONE-acetaminophen (PERCOCET/ROXICET) 5-325 MG  tablet Take 1 tablet by mouth every 4 (four) hours as needed for moderate pain. 30 tablet 0   potassium chloride (KLOR-CON) 10 MEQ tablet Take 10 mEq by mouth 2 (two) times daily.       sitaGLIPtin (JANUVIA) 100 MG tablet Take 1 tablet by mouth daily.       tiZANidine (ZANAFLEX) 4 MG capsule TAKE 1 CAPSULE BY MOUTH 3 TIMES DAILY AS NEEDED       Vitamin D, Ergocalciferol, (DRISDOL) 1.25 MG (50000 UNIT) CAPS capsule Take 1 capsule by mouth once a week.       zolpidem (AMBIEN CR) 12.5 MG CR tablet Take 12.5 mg by mouth at bedtime as needed for sleep.       asenapine (SAPHRIS) 5 MG SUBL 24 hr tablet Place 5 mg under the tongue at bedtime. (Patient not taking: Reported on 04/22/2021)       clorazepate (TRANXENE) 7.5 MG tablet Take 7.5 mg by mouth 2 (two) times daily. (Patient not taking: Reported on 04/22/2021)       conjugated estrogens (PREMARIN) vaginal cream Place vaginally. (Patient not taking: Reported on 04/22/2021)       cyclobenzaprine (FLEXERIL) 10 MG tablet Take 1 tablet (10 mg total) by mouth 3 (three) times daily as needed for muscle spasms. (Patient not taking: Reported on 04/22/2021) 30 tablet 0   Diclofenac Potassium,Migraine, 50 MG PACK Take by mouth. (Patient not taking: Reported on 04/22/2021)       Docusate Sodium (DSS) 100 MG CAPS Frequency:   Dosage:0   MG  Instructions:  Note:takes as needed (Patient not taking: Reported on 04/22/2021)       estradiol (ESTRACE) 1 MG tablet Take 1 mg by mouth daily. (Patient not taking: Reported on 04/22/2021)       Estradiol 10 MCG TABS vaginal tablet Place in vagina q. Mondays and Thursdays at bedtime (Patient not taking: Reported on 04/22/2021)       Estradiol 10 MCG TABS vaginal tablet Place 1 tablet vaginally 2 (two) times a week. (Patient not taking: Reported on 04/22/2021)       Hyoscyamine Sulfate SL 0.125 MG SUBL Place under the tongue.       meloxicam (MOBIC) 15 MG tablet Take 25 mg by mouth every 6 (six) hours as needed for pain. (Patient not taking:  Reported on 04/22/2021)       methylPREDNISolone (MEDROL DOSEPAK) 4 MG TBPK tablet Take by mouth. (Patient not taking: Reported on 04/22/2021)       Multiple Vitamin (MULTIVITAMIN ADULT PO) Frequency:   Dosage:0.0     Instructions:Multiple Vitamins ( TABS, 1 Oral )  Note: (Patient not taking: Reported on 04/22/2021)       Polyethyl Glycol-Propyl Glycol (SYSTANE FREE OP) Apply to eye. (Patient not taking: Reported on 04/22/2021)       Polyethyl Glycol-Propyl Glycol (SYSTANE OP) Place 1 drop into both eyes at bedtime as needed (dry eyes). (Patient not taking: Reported on 04/22/2021)       traMADol (ULTRAM) 50 MG tablet Take 50-100 mg by mouth every 6 (six) hours as needed. (Patient not taking: Reported on 04/22/2021)       Vilazodone HCl 20 MG TABS Take 20 mg by mouth daily.  No current facility-administered medications for this visit.      REVIEW OF SYSTEMS:  '[X]'  denotes positive finding, '[ ]'  denotes negative finding Cardiac   Comments:  Chest pain or chest pressure:      Shortness of breath upon exertion:      Short of breath when lying flat:      Irregular heart rhythm:             Vascular      Pain in calf, thigh, or hip brought on by ambulation:      Pain in feet at night that wakes you up from your sleep:      Blood clot in your veins:      Leg swelling:  x           Pulmonary      Oxygen at home:      Productive cough:      Wheezing:             Neurologic      Sudden weakness in arms or legs:      Sudden numbness in arms or legs:      Sudden onset of difficulty speaking or slurred speech:      Temporary loss of vision in one eye:      Problems with dizziness:             Gastrointestinal      Blood in stool:      Vomited blood:             Genitourinary      Burning when urinating:      Blood in urine:             Psychiatric      Major depression:             Hematologic      Bleeding problems:      Problems with blood clotting too easily:             Skin       Rashes or ulcers:             Constitutional      Fever or chills:          PHYSICAL EXAM:    Vitals:    04/22/21 1144  BP: 105/66  Pulse: 72  Resp: 14  Temp: 97.6 F (36.4 C)  TempSrc: Temporal  SpO2: 97%  Weight: 161 lb (73 kg)  Height: '5\' 3"'  (1.6 m)      GENERAL: The patient is a well-nourished female, in no acute distress. The vital signs are documented above. CARDIAC: There is a regular rate and rhythm. VASCULAR:  Palpable femoral pulses bilaterally Palpable DP pulses bilateral PULMONARY: No respiratory distress. ABDOMEN: Soft and non-tender .  Lower abdominal transverse incision. MUSCULOSKELETAL: There are no major deformities or cyanosis. NEUROLOGIC: No focal weakness or paresthesias are detected. SKIN: There are no ulcers or rashes noted. PSYCHIATRIC: The patient has a normal affect.   DATA:    CT lumbar spine reviewed from 02/01/2021 and the aortic bifurcation at L4 and vein bifurcation appears to be at L4-L5 disc space.   Assessment/Plan:   75 year old female with chronic lower back pain who presents for preop evaluation of abdominal exposure for L5-S1 ALIF.  I have reviewed her CT lumbar spine from 02/02/2021 and also after physical exam, I think she would be a good candidate for anterior approach.  I discussed a transverse incision  over the left rectus muscle where the L5-S1 disc space will be marked.  Discussed mobilizing the left rectus muscle and then entering the retroperitoneum to mobilize the peritoneum and left ureter across midline.  We discussed mobilizing iliac artery and vein if necessary.  Discussed risk and injury to the above structures.  All questions answered.  Look forward to assisting Dr. Saintclair Halsted.     Tammy Heck, MD [facility]: [phone]

## 2021-05-05 NOTE — Op Note (Signed)
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  Postoperative diagnosis: Same.  Procedure #1 anterior lumbar interbody fusion utilizing the globus integrated cage where Shanks packed with ostia cell pro exposure with Dr. Sherald Hess dictated in a separate op note utilizing an 11 mm tall medium footprint 8 degree cage  2.  Through separate skin incision anterior lateral interbody fusion L1-L2 utilizing the [employer] XLIF anterior lateral system with a 8 mm tall 0 degree 18 mm with 40 mm length peek cage packed with ostia cell pro  3.  Patient repositioned prone for posterior pedicle screw fixation L1-L3 with removal of previously placed knots and rods at L2-3 connecting along the rod from L1-L3 and placement of bilateral new L1 pedicle screws utilizing the Aero stereotactic navigation system  4.  Pedicle screw fixation L5 to the pelvis with iliac fixation utilizing the Aero stereotactic navigation system and globus iliac and pedicle screws's 5.5 Creo modular with threaded locking caps and placement of bilateral iliac screws and bilateral L5 pedicle screws  5.  Posterior lateral arthrodesis L1-L2 as well as L5 to the sacral ala utilizing vivigen, Proteus and ostia cell pro  6.  Lumbar laminectomy microdiscectomy L5-S1 the left with microdissection of the left S1 nerve root microscopic discectomy  Surgeon: Jillyn Hidden Amaree Leeper  Co-surgeon for the abdominal exposure for the ALIF: Dr. Sherald Hess  Assistant for the XLIF and the posterior screw placement and microdiscectomy: Julien Girt  Anesthesia: General  EBL: 52  HPI: 75 year old female previous undergone an L2-L5 fusion with segmental instability at L1-L2 and L5-S1.  Due to the patient's failed conservative treatment  imaging findings and progression of clinical syndrome I recommended reexploration fusion removal of hardware L2-3 anterior lumbar interbody fusion L5-S1 anterior lateral interbody fusion L1-L2 posterior screw augmentation L1 down the pelvis with microdiscectomy on the left.  I extensively over the risks and benefits of this procedure with her and her husband as well as perioperative course expectations of outcome and alternatives of surgery and she understood and agreed to proceed forward.  Operative procedure: Patient was brought into the OR was [place] general anesthesia positioned initially supine for the anterior lumbar interbody fusion.  Exposure dictated by Dr. Chestine Spore after adequate exposure been achieved and fluoroscopy confirmed correct level dissipates was incised to space was cleaned out with curettes pituitaries and Kerrison punches until adequate endplate preparation been achieved and confirmation of complete total discectomy was carried out.  I then selected an 11 mm 8 degree trial places under fluoroscopy confirmed good position I then packed the cage of appropriate size inserted under fluoroscopy selected 25 mm Shanks and deployed the Koppel 1 superiorly to inferiorly.  Then as I pulled out the retractor inspected the wound meticulous hemostasis was maintained I closed the external oblique and transversalis fascia subcutaneous tissue was closed with running Vicryl's and a running 4 subcuticular.  Patient was then remitted and repositioned in the lateral for the anterior lateral interbody fusion.  Patient been reversed from her neuromuscular stimulation and utilizing fluoroscopy selected the appropriate incision between the 11th and 12th ribs to access the L1-2 disc base.  After patient was positioned and taped and placed on the table with her left side of her back was prepped and draped in routine sterile fashion utilizing the 2 incision technique I was able to get in the retroperitoneal needle  space  in the posterior incision palpating the posterior and ventral aspect of the transfer process.  Then sweeping up I freed up the retroperitoneum from the inside of the ribs and made a lateral incision and passed the dilator on my finger down to the Tran psoas space and under fluoroscopy confirmed docking on the lateral aspect and posterior aspect of the L1-2 disc base.  Then we stimulated each step along the way and never got a reading less than 20.  Then sequential dilation retractor was placed retractor was positioned in the posterior one third aspect of the disc base opened up the retractor anteriorly created by working channel again stimulated in 3 and 6 degree orientation to confirm adequate in good location.  Then incised the disc base cleaned out the disc base with scrapers rasps dilated up with sequential dilation and selected an 8 mm 0 degree implant and opened up the cage of peek cage packed with the ostia cell pro and deployed it.  After fluoroscopy confirmed good position of the cage the wounds copiously irrigated to Kassim states was maintained both incisions were closed with Vicryl and the patient was then remitted for the posterior part of the operation.  Patient was then repositioned prone on the Aero stereotactic [place] table roll incision was opened up and extended cephalad caudally.  I exposed the pedicle screw entry points at L1 as well as L5 expose the posterior superior iliac spine through a separate fascial incision for iliac screw placement.  Then after adequate exposure been achieved and exposed her old fusion from L2-3 and interspinous fusion of down to L5 I then placed the reference array on the spinous processes of T12 we then ran a CT scan.  Confirmed adequate registration with a CT scan and then proceeded to utilize the Aero stereotactic navigation system and placed bilateral iliac screws in routine fashion.  I then placed both L5 screws and both L1 screws.  We then checked a post  placement CT scan and it appeared that the L1 and L5 screws in the left had breached laterally so we with 1 back and we remove the L1 and L5 replaced and with a more medial trajectory checked into the post placement scan that confirmed good position.  At this point attention taken to the microdiscectomy identified the left L5-S1 interspace with a 2 intermittent Kerrison punch and begin the laminotomy extended down inferiorly to the inferior one third of L5 and superior one third of S1 removed ligament flavum was noted markedly hypertrophied under bit the medial facet and identified the S1 pedicle the operating microscope was draped and brought into the field and microscope lamination dissect the S1 nerve root off the pedicle and off of large partially calcified disc herniation at L5-S1.  Incised the disc remove the calcification with a 2 Miller Kerrison punch.  Then utilizing Epstein curettes pituitary rongeurs cleaned out extensive my disc material underneath the facet joint and lateral that was displacing the S1 nerve root at the end discectomies no further stenosis on thecal sac or left S1 nerve root.  This was packed with Gelfoam and patty.    Then we remove the reference array I then proceeded to disconnect the old fusion at L2-3 which appeared solid and disconnected that disconnected the rods assembled the head advance the L1 screw little bit and connected rods up and anchored everything in place.  I then aggressively decorticated the lateral masses and pars and TPs from L1 down to below  L2 and packed an extensive mount of autograft mix as well as the ostia cell pro vivid gin and Proteus.  Then attention taken to the iliac screw and L5 fixation I passed the connector subfascially and into the iliac screw lined everything up with L5 assembled the heads and L5 and anchored everything in place and tightened everything down down below.  Again aggressive decorticated facet joints TPs from L5 down to the ala and  packed the remainder of the autograft and allograft mixture posterior laterally from L5 down the sacral ala.  The wounds and copiously irrigated meticulous hemostasis was maintained I placed a medium Hemovac drain injected Exparel in the fascia and closed the wound in layers with opted Vicryl and a running 4 subcuticular Dermabond benzoin Steri-Strips and a sterile dressing was applied patient recovery in stable condition.  At the end the case all needle count sponge counts were correct.

## 2021-05-05 NOTE — Anesthesia Preprocedure Evaluation (Addendum)
Anesthesia Evaluation  Patient identified by MRN, date of birth, ID band Patient awake    Reviewed: Allergy & Precautions, NPO status , Patient's Chart, lab work & pertinent test results  History of Anesthesia Complications (+) PONV and history of anesthetic complications  Airway Mallampati: II  TM Distance: >3 FB Neck ROM: Full    Dental  (+) Teeth Intact, Dental Advisory Given   Pulmonary sleep apnea and Continuous Positive Airway Pressure Ventilation ,    Pulmonary exam normal        Cardiovascular hypertension, Pt. on medications  Rhythm:Regular Rate:Normal     Neuro/Psych  Headaches, PSYCHIATRIC DISORDERS Anxiety Depression    GI/Hepatic Neg liver ROS, GERD  Medicated and Controlled,  Endo/Other  diabetes, Type 2, Oral Hypoglycemic Agents, Insulin Dependent  Renal/GU CRFRenal disease     Musculoskeletal  (+) Arthritis ,   Abdominal Normal abdominal exam  (+)   Peds  Hematology negative hematology ROS (+)   Anesthesia Other Findings   Reproductive/Obstetrics                            Anesthesia Physical Anesthesia Plan  ASA: 2  Anesthesia Plan: General   Post-op Pain Management:    Induction: Intravenous  PONV Risk Score and Plan: 4 or greater and Ondansetron, Dexamethasone, Midazolam and Scopolamine patch - Pre-op  Airway Management Planned: Oral ETT  Additional Equipment: Arterial line, CVP and Ultrasound Guidance Line Placement  Intra-op Plan:   Post-operative Plan: Possible Post-op intubation/ventilation  Informed Consent: I have reviewed the patients History and Physical, chart, labs and discussed the procedure including the risks, benefits and alternatives for the proposed anesthesia with the patient or authorized representative who has indicated his/her understanding and acceptance.     Dental advisory given  Plan Discussed with: CRNA  Anesthesia Plan  Comments:        Anesthesia Quick Evaluation

## 2021-05-05 NOTE — Anesthesia Procedure Notes (Signed)
Central Venous Catheter Insertion Performed by: Shelton Silvas, MD, anesthesiologist Start/End7/18/2022 7:15 AM, 05/05/2021 7:23 AM Patient location: Pre-op. Preanesthetic checklist: patient identified, IV checked, site marked, risks and benefits discussed, surgical consent, monitors and equipment checked, pre-op evaluation, timeout performed and anesthesia consent Position: Trendelenburg Lidocaine 1% used for infiltration and patient sedated Hand hygiene performed , maximum sterile barriers used  and Seldinger technique used Catheter size: 8 Fr Total catheter length 16. Central line was placed.Double lumen Procedure performed using ultrasound guided technique. Ultrasound Notes:anatomy identified, needle tip was noted to be adjacent to the nerve/plexus identified, no ultrasound evidence of intravascular and/or intraneural injection and image(s) printed for medical record Attempts: 1 Following insertion, dressing applied, line sutured and Biopatch. Post procedure assessment: blood return through all ports  Patient tolerated the procedure well with no immediate complications.

## 2021-05-05 NOTE — Transfer of Care (Signed)
Immediate Anesthesia Transfer of Care Note  Patient: Tammy Hicks  Procedure(s) Performed: LUMBAR FIVE-SACRAL ONE ANTERIOR LUMBAR INTERBODY FUSION (Spine Lumbar) AIRO stereotactic navigation LUMBAR ONE-TWO ANTERIOR LATERAL LUMBAR INTERBODY FUSION (Left: Spine Lumbar) PLACEMENT OF LUMBAR SCREWS WITH PELVIC FIXATION USING ILIAC SCREWS (Spine Lumbar) LEFT LUMBAR FIVE-SACRAL ONE MICRODISCECTOMY (Spine Lumbar) ABDOMINAL EXPOSURE  Patient Location: PACU  Anesthesia Type:General  Level of Consciousness: awake  Airway & Oxygen Therapy: Patient Spontanous Breathing  Post-op Assessment: Report given to RN, Patient moving all extremities X 4 and Patient able to stick tongue midline  Post vital signs: Reviewed and stable  Last Vitals:  Vitals Value Taken Time  BP 115/62 05/05/21 1751  Temp    Pulse 99 05/05/21 1755  Resp 18 05/05/21 1755  SpO2 94 % 05/05/21 1755  Vitals shown include unvalidated device data.  Last Pain:  Vitals:   05/05/21 0604  TempSrc:   PainSc: 10-Worst pain ever         Complications: No notable events documented.

## 2021-05-05 NOTE — H&P (Signed)
Tammy Hicks is an 75 y.o. female.   Chief Complaint: back pain HPI: 75 yo with a long history of back andleg pain prexsents with progressively worse pain in low back both hips and legs.  Workup revealed segmental instability above and below her fusion at L12 and L5-S1.  Because of her failure of conservative treatment nad progression of clinical syndrome, ive recommended interbody fusions at those levels over 3 approaches, ALIF, Antero-lateral and posterior.  Ive reviewed the risks and benefits, peri-operative course, expectations of outcome and alternatives and she agrees to proceed foreward.  Past Medical History:  Diagnosis Date   Anxiety    Chronic kidney disease    Stage 3 CKD   Depression    Diabetes mellitus without complication (HCC)    Family history of adverse reaction to anesthesia    "my sister gets really sick afterwards"   GERD (gastroesophageal reflux disease)    Headache    migraines   History of kidney stones    Hyperlipidemia    Hypertension    Insomnia    Osteoarthritis    PONV (postoperative nausea and vomiting)    "progresses to migraine"   Restless leg syndrome    Sleep apnea    uses CPAP    Past Surgical History:  Procedure Laterality Date   ABDOMINAL HYSTERECTOMY     BACK SURGERY     x2    BREAST SURGERY     Fibroid tumors removed   CESAREAN SECTION     COLONOSCOPY     x2   LITHOTRIPSY     "several"   TONSILLECTOMY      History reviewed. No pertinent family history. Social History:  reports that she has never smoked. She has never used smokeless tobacco. She reports that she does not drink alcohol and does not use drugs.  Allergies:  Allergies  Allergen Reactions   Pioglitazone Swelling and Other (See Comments)    Leg swelling Fatigue AKA: ACTOS  Leg swelling   Primidone Palpitations, Shortness Of Breath and Other (See Comments)    DYSPNEA   Flexeril [Cyclobenzaprine] Other (See Comments)    Confusion   Codeine Nausea Only and Nausea  And Vomiting    Severe nausea     Medications Prior to Admission  Medication Sig Dispense Refill   acetaminophen (TYLENOL) 500 MG tablet Take 1,000-1,500 mg by mouth every 4 (four) hours as needed for moderate pain.     azelastine (ASTELIN) 0.1 % nasal spray Place 2 sprays into both nostrils 2 (two) times daily as needed for rhinitis or allergies.     desvenlafaxine (PRISTIQ) 50 MG 24 hr tablet Take 50 mg by mouth daily.     gabapentin (NEURONTIN) 100 MG capsule Take 100 mg by mouth 3 (three) times daily.     glipiZIDE (GLUCOTROL XL) 5 MG 24 hr tablet Take 5 mg by mouth 2 (two) times daily.     hydrOXYzine (ATARAX/VISTARIL) 10 MG tablet Take 10 mg by mouth every 8 (eight) hours as needed for anxiety.     insulin regular (NOVOLIN R) 100 units/mL injection Inject 2-8 Units into the skin 3 (three) times daily as needed for high blood sugar.     metFORMIN (GLUCOPHAGE) 500 MG tablet Take 500 mg by mouth 2 (two) times daily with a meal.     olmesartan-hydrochlorothiazide (BENICAR HCT) 40-25 MG tablet Take 1 tablet by mouth daily.     omeprazole (PRILOSEC) 20 MG capsule Take 20 mg by mouth daily.  oxyCODONE-acetaminophen (PERCOCET) 10-325 MG tablet Take 1 tablet by mouth every 4 (four) hours as needed for pain.     potassium chloride SA (KLOR-CON) 20 MEQ tablet Take 20 mEq by mouth 2 (two) times daily.     pramipexole (MIRAPEX) 1 MG tablet Take 1 mg by mouth at bedtime.     pravastatin (PRAVACHOL) 10 MG tablet Take 10 mg by mouth daily.     sitaGLIPtin (JANUVIA) 100 MG tablet Take 100 mg by mouth daily.     traMADol (ULTRAM) 50 MG tablet Take 50-100 mg by mouth every 6 (six) hours as needed for severe pain.     Vitamin D, Ergocalciferol, (DRISDOL) 1.25 MG (50000 UNIT) CAPS capsule Take 50,000 Units by mouth once a week.     ACETAMINOPHEN-BUTALBITAL 50-325 MG TABS Take 1 tablet by mouth every 4 (four) hours as needed (headache).     aspirin 81 MG EC tablet Take 81 mg by mouth daily.     Blood  Glucose Monitoring Suppl (ONE TOUCH ULTRA 2) w/Device KIT Use as instructed. Check blood sugar 2 times per day. Dx. E11.9     Estradiol 10 MCG TABS vaginal tablet Place 1 tablet vaginally 2 (two) times a week.     fluticasone (FLONASE) 50 MCG/ACT nasal spray Place 1 spray into both nostrils daily as needed for allergies.     furosemide (LASIX) 40 MG tablet Take 40 mg by mouth daily as needed for fluid.     Insulin Syringe-Needle U-100 (B-D INS SYR ULTRAFINE 1CC/30G) 30G X 1/2" 1 ML MISC See admin instructions.     Insulin Syringe-Needle U-100 25G X 1" 1 ML MISC Use 3 times a day as needed     meclizine (ANTIVERT) 25 MG tablet Take 25 mg by mouth every 6 (six) hours as needed for dizziness.     OneTouch Delica Lancets 40H MISC 1 each by Other route 2 times daily. Dx E11.9     zolpidem (AMBIEN CR) 12.5 MG CR tablet Take 12.5 mg by mouth at bedtime as needed for sleep.      Results for orders placed or performed during the hospital encounter of 05/05/21 (from the past 48 hour(s))  Glucose, capillary     Status: None   Collection Time: 05/05/21  5:42 AM  Result Value Ref Range   Glucose-Capillary 94 70 - 99 mg/dL    Comment: Glucose reference range applies only to samples taken after fasting for at least 8 hours.  Prepare RBC (crossmatch)     Status: None   Collection Time: 05/05/21  6:23 AM  Result Value Ref Range   Order Confirmation      ORDER PROCESSED BY BLOOD BANK Performed at [facility], [place] [address], [place], [place] [address]    No results found.  Review of Systems  Musculoskeletal:  Positive for back pain.  Neurological:  Positive for numbness.   Blood pressure 130/66, pulse 70, temperature (!) 97.5 F (36.4 C), temperature source Oral, resp. rate 18, height '5\' 3"'  (1.6 m), weight 73 kg, SpO2 98 %. Physical Exam HENT:     Right Ear: Tympanic membrane normal.     Nose: Nose normal.  Eyes:     Pupils: Pupils are equal, round, and reactive to light.  Cardiovascular:      Rate and Rhythm: Normal rate.  Pulmonary:     Effort: Pulmonary effort is normal.  Abdominal:     General: Abdomen is flat.  Musculoskeletal:  General: Normal range of motion.  Skin:    General: Skin is warm.  Neurological:     General: No focal deficit present.     Mental Status: She is alert.  Psychiatric:        Mood and Affect: Mood normal.     Assessment/Plan 75 yo presents for ALIF L5-S1, Anterior-lateral L1-2, and posterior screws with pelvic fixation with iliac screws  Elaina Hoops, MD 05/05/2021, 7:23 AM

## 2021-05-06 ENCOUNTER — Inpatient Hospital Stay (HOSPITAL_COMMUNITY): Payer: Medicare Other

## 2021-05-06 DIAGNOSIS — M48061 Spinal stenosis, lumbar region without neurogenic claudication: Secondary | ICD-10-CM | POA: Diagnosis not present

## 2021-05-06 DIAGNOSIS — R479 Unspecified speech disturbances: Secondary | ICD-10-CM

## 2021-05-06 DIAGNOSIS — G9340 Encephalopathy, unspecified: Secondary | ICD-10-CM

## 2021-05-06 LAB — CBC WITH DIFFERENTIAL/PLATELET
Abs Immature Granulocytes: 0.03 10*3/uL (ref 0.00–0.07)
Basophils Absolute: 0 10*3/uL (ref 0.0–0.1)
Basophils Relative: 0 %
Eosinophils Absolute: 0.1 10*3/uL (ref 0.0–0.5)
Eosinophils Relative: 1 %
HCT: 22.9 % — ABNORMAL LOW (ref 36.0–46.0)
Hemoglobin: 7.5 g/dL — ABNORMAL LOW (ref 12.0–15.0)
Immature Granulocytes: 0 %
Lymphocytes Relative: 14 %
Lymphs Abs: 1.3 10*3/uL (ref 0.7–4.0)
MCH: 30.5 pg (ref 26.0–34.0)
MCHC: 32.8 g/dL (ref 30.0–36.0)
MCV: 93.1 fL (ref 80.0–100.0)
Monocytes Absolute: 0.7 10*3/uL (ref 0.1–1.0)
Monocytes Relative: 7 %
Neutro Abs: 7.2 10*3/uL (ref 1.7–7.7)
Neutrophils Relative %: 78 %
Platelets: 209 10*3/uL (ref 150–400)
RBC: 2.46 MIL/uL — ABNORMAL LOW (ref 3.87–5.11)
RDW: 14 % (ref 11.5–15.5)
WBC: 9.2 10*3/uL (ref 4.0–10.5)
nRBC: 0 % (ref 0.0–0.2)

## 2021-05-06 LAB — BPAM RBC
Blood Product Expiration Date: 202207242359
Blood Product Expiration Date: 202207242359
Unit Type and Rh: 6200
Unit Type and Rh: 6200

## 2021-05-06 LAB — CBC
HCT: 26.2 % — ABNORMAL LOW (ref 36.0–46.0)
Hemoglobin: 8.8 g/dL — ABNORMAL LOW (ref 12.0–15.0)
MCH: 30.7 pg (ref 26.0–34.0)
MCHC: 33.6 g/dL (ref 30.0–36.0)
MCV: 91.3 fL (ref 80.0–100.0)
Platelets: 199 10*3/uL (ref 150–400)
RBC: 2.87 MIL/uL — ABNORMAL LOW (ref 3.87–5.11)
RDW: 14.6 % (ref 11.5–15.5)
WBC: 11.1 10*3/uL — ABNORMAL HIGH (ref 4.0–10.5)
nRBC: 0 % (ref 0.0–0.2)

## 2021-05-06 LAB — URINALYSIS, COMPLETE (UACMP) WITH MICROSCOPIC
Bilirubin Urine: NEGATIVE
Glucose, UA: NEGATIVE mg/dL
Ketones, ur: NEGATIVE mg/dL
Nitrite: NEGATIVE
Protein, ur: NEGATIVE mg/dL
Specific Gravity, Urine: 1.016 (ref 1.005–1.030)
pH: 5 (ref 5.0–8.0)

## 2021-05-06 LAB — COMPREHENSIVE METABOLIC PANEL
ALT: 10 U/L (ref 0–44)
AST: 23 U/L (ref 15–41)
Albumin: 3.2 g/dL — ABNORMAL LOW (ref 3.5–5.0)
Alkaline Phosphatase: 32 U/L — ABNORMAL LOW (ref 38–126)
Anion gap: 8 (ref 5–15)
BUN: 20 mg/dL (ref 8–23)
CO2: 23 mmol/L (ref 22–32)
Calcium: 8.5 mg/dL — ABNORMAL LOW (ref 8.9–10.3)
Chloride: 107 mmol/L (ref 98–111)
Creatinine, Ser: 1.25 mg/dL — ABNORMAL HIGH (ref 0.44–1.00)
GFR, Estimated: 45 mL/min — ABNORMAL LOW (ref >60)
Glucose, Bld: 149 mg/dL — ABNORMAL HIGH (ref 70–99)
Potassium: 3.8 mmol/L (ref 3.5–5.1)
Sodium: 138 mmol/L (ref 135–145)
Total Bilirubin: 0.8 mg/dL (ref 0.3–1.2)
Total Protein: 5.2 g/dL — ABNORMAL LOW (ref 6.5–8.1)

## 2021-05-06 LAB — TYPE AND SCREEN
ABO/RH(D): A POS
Antibody Screen: NEGATIVE
Unit division: 0
Unit division: 0

## 2021-05-06 LAB — TSH: TSH: 0.851 u[IU]/mL (ref 0.350–4.500)

## 2021-05-06 LAB — PREPARE RBC (CROSSMATCH)

## 2021-05-06 LAB — GLUCOSE, CAPILLARY
Glucose-Capillary: 133 mg/dL — ABNORMAL HIGH (ref 70–99)
Glucose-Capillary: 91 mg/dL (ref 70–99)
Glucose-Capillary: 156 mg/dL — ABNORMAL HIGH (ref 70–99)
Glucose-Capillary: 86 mg/dL (ref 70–99)
Glucose-Capillary: 138 mg/dL — ABNORMAL HIGH (ref 70–99)

## 2021-05-06 LAB — VITAMIN B12: Vitamin B-12: 288 pg/mL (ref 180–914)

## 2021-05-06 MED ORDER — SODIUM CHLORIDE 0.9 % IV BOLUS
1000.0000 mL | Freq: Once | INTRAVENOUS | Status: AC
  Administered 2021-05-06: 1000 mL via INTRAVENOUS

## 2021-05-06 MED ORDER — SODIUM CHLORIDE 0.9% IV SOLUTION: Freq: Once | INTRAVENOUS | Status: AC

## 2021-05-06 MED ORDER — CHLORHEXIDINE GLUCONATE CLOTH 2 % EX PADS
6.0000 | MEDICATED_PAD | Freq: Every day | CUTANEOUS | Status: DC
  Administered 2021-05-06 – 2021-05-07 (×2): 6 via TOPICAL

## 2021-05-06 MED ORDER — IOHEXOL 350 MG/ML SOLN
50.0000 mL | Freq: Once | INTRAVENOUS | Status: AC | PRN
  Administered 2021-05-06: 50 mL via INTRAVENOUS

## 2021-05-06 NOTE — Plan of Care (Signed)

## 2021-05-06 NOTE — Evaluation (Signed)
Physical Therapy Evaluation Patient Details Name: Tammy Hicks MRN: [id number] DOB: Mar 11, 1946 Today's Date: 05/06/2021   History of Present Illness  75 yo female ALIF L5-S1 anterior retroperitoneal approach, XLIF L1-2, L5-S1 lumbar laminectomy, microdiscectomy, and posterior screws with pelvic fixation with iliac screws. PMH anxiety, CKDIII DM Depression, migraines, Hx kidney stones, HTN, OA, insomnia, PONV, restless leg syndrom Sleep apnea uses Cpap back surgery x2,  Clinical Impression  PTA, pt lives with her spouse and is independent. Pt presents with decreased functional mobility secondary to decreased cognition, weakness, hypotension, balance deficits, and pain. Pt requiring moderate assist and a walker to progress from the bed to chair. BP 93/54 (62) sitting. Will benefit from post acute rehab to address deficits, decrease caregiver burden and maximize functional mobility.     Follow Up Recommendations CIR    Equipment Recommendations  Wheelchair (measurements PT);Wheelchair cushion (measurements PT)    Recommendations for Other Services       Precautions / Restrictions Precautions Precautions: Back Precaution Booklet Issued: Yes (comment) Precaution Comments: watch BP Required Braces or Orthoses: Spinal Brace Spinal Brace: Lumbar corset;Applied in sitting position Restrictions Weight Bearing Restrictions: No      Mobility  Bed Mobility Overal bed mobility: Needs Assistance Bed Mobility: Sidelying to Sit   Sidelying to sit: Min assist       General bed mobility comments: received in right sidelying, cues for bringing legs off edge of bed, minA for trunk to upright    Transfers Overall transfer level: Needs assistance Equipment used: Rolling walker (2 wheeled) Transfers: Sit to/from [employer] Sit to Stand: Mod assist Stand pivot transfers: Mod assist       General transfer comment: Assist for power up, pt pushing walker out in front of her,  unable to maintain safe positioning inside of walker despite max multimodal cueing. Took pivotal steps over to chair with cues for sequencing/direction, manual assist of walker. ModA for balance due to posterior lean.  Ambulation/Gait                Stairs            Wheelchair Mobility    Modified Rankin (Stroke Patients Only)       Balance Overall balance assessment: Needs assistance Sitting-balance support: Bilateral upper extremity supported;Feet supported Sitting balance-Leahy Scale: Fair     Standing balance support: Bilateral upper extremity supported Standing balance-Leahy Scale: Poor Standing balance comment: reliant on external support                             Pertinent Vitals/Pain Pain Assessment: Faces Faces Pain Scale: Hurts even more Pain Location: surgical site Pain Descriptors / Indicators: Discomfort;Grimacing;Guarding Pain Intervention(s): Limited activity within patient's tolerance;Monitored during session    Home Living Family/patient expects to be discharged to:: Private residence Living Arrangements: Spouse/significant other;Other relatives Available Help at Discharge: Family;Available 24 hours/day Type of Home: House Home Access: Stairs to enter Entrance Stairs-Rails: None Entrance Stairs-Number of Steps: 2 Home Layout: One level Home Equipment: Walker - 2 wheels;Cane - single point;Bedside commode Additional Comments: 3 cats ( patches, jelly bean, chloe)    Prior Function Level of Independence: Independent               Hand Dominance   Dominant Hand: Right    Extremity/Trunk Assessment   Upper Extremity Assessment Upper Extremity Assessment: Overall WFL for tasks assessed    Lower Extremity Assessment Lower Extremity Assessment:  Generalized weakness    Cervical / Trunk Assessment Cervical / Trunk Assessment: Other exceptions (s/p surg and x2 previous surgeries)  Communication   Communication: No  difficulties  Cognition Arousal/Alertness: Awake/alert Behavior During Therapy: Flat affect Overall Cognitive Status: Impaired/Different from baseline Area of Impairment: Orientation;Attention;Memory;Following commands;Safety/judgement;Awareness;Problem solving                 Orientation Level: Disoriented to;Time;Situation Current Attention Level: Sustained Memory: Decreased recall of precautions;Decreased short-term memory Following Commands: Follows one step commands inconsistently;Follows one step commands with increased time Safety/Judgement: Decreased awareness of safety;Decreased awareness of deficits Awareness: Intellectual Problem Solving: Slow processing;Decreased initiation;Difficulty sequencing General Comments: pt oriented to self and place only. pt able to name spouse but unaware of how long they have been married. pt with no recall of reason for admission. pt when told states 'oh yeah thats right" pt and spouse report that pt has hx of delayed cognition after past surgeries.      General Comments General comments (skin integrity, edema, etc.): Bp 93/54 (62) sitting and after standing and taking step forward adn backward 71/39 (49) and after SCD applied and ankle pumps 91/48 (60) RN in room to address BP    Exercises     Assessment/Plan    PT Assessment Patient needs continued PT services  PT Problem List Decreased strength;Decreased activity tolerance;Decreased balance;Decreased mobility;Decreased cognition;Decreased safety awareness;Pain       PT Treatment Interventions DME instruction;Stair training;Functional mobility training;Gait training;Therapeutic activities;Therapeutic exercise;Balance training;Patient/family education    PT Goals (Current goals can be found in the Care Plan section)  Acute Rehab PT Goals Patient Stated Goal: none stated at this time PT Goal Formulation: With patient Time For Goal Achievement: 05/20/21 Potential to Achieve Goals:  Good    Frequency Min 5X/week   Barriers to discharge        Co-evaluation               AM-PAC PT "6 Clicks" Mobility  Outcome Measure Help needed turning from your back to your side while in a flat bed without using bedrails?: A Little Help needed moving from lying on your back to sitting on the side of a flat bed without using bedrails?: A Little Help needed moving to and from a bed to a chair (including a wheelchair)?: A Lot Help needed standing up from a chair using your arms (e.g., wheelchair or bedside chair)?: A Lot Help needed to walk in hospital room?: A Lot Help needed climbing 3-5 steps with a railing? : Total 6 Click Score: 13    End of Session Equipment Utilized During Treatment: Gait belt;Back brace Activity Tolerance: Patient limited by fatigue;Patient limited by pain Patient left: in chair;with call bell/phone within reach;with family/visitor present Nurse Communication: Mobility status PT Visit Diagnosis: Unsteadiness on feet (R26.81);Difficulty in walking, not elsewhere classified (R26.2);Pain Pain - part of body:  (back)    Time: 9326-7124 PT Time Calculation (min) (ACUTE ONLY): 29 min   Charges:   PT Evaluation $PT Eval Moderate Complexity: 1 Mod PT Treatments $Therapeutic Activity: 8-22 mins        Lillia Pauls, PT, DPT Acute Rehabilitation Services Pager [phone] Office [phone]   Norval Morton 05/06/2021, 2:26 PM

## 2021-05-06 NOTE — Progress Notes (Addendum)
  Progress Note    05/06/2021 7:24 AM 1 Day Post-Op  Subjective:  says she feels better  afebrile  Vitals:   05/05/21 2334 05/06/21 0319  BP: 101/63 118/67  Pulse: (!) 107 (!) 104  Resp: 20   Temp: 98.8 F (37.1 C) 97.9 F (36.6 C)  SpO2: 94% 90%    Physical Exam: General:  no distress Lungs:  non labored Incisions:  honeycomb dressing in place that is clean and dry Extremities:  easily palpable DP pulses bilaterally Abdomen:  soft, NT/ND; +flatus  CBC    Component Value Date/Time   WBC 17.1 (H) 05/05/2021 1830   RBC 3.07 (L) 05/05/2021 1830   HGB 9.4 (L) 05/05/2021 1830   HCT 28.0 (L) 05/05/2021 1830   PLT 252 05/05/2021 1830   MCV 91.2 05/05/2021 1830   MCH 30.6 05/05/2021 1830   MCHC 33.6 05/05/2021 1830   RDW 13.6 05/05/2021 1830    BMET    Component Value Date/Time   NA 136 05/05/2021 1830   K 4.2 05/05/2021 1830   CL 107 05/05/2021 1830   CO2 20 (L) 05/05/2021 1830   GLUCOSE 179 (H) 05/05/2021 1830   BUN 18 05/05/2021 1830   CREATININE 0.90 05/05/2021 1830   CALCIUM 8.9 05/05/2021 1830   GFRNONAA >60 05/05/2021 1830   GFRAA >60 06/14/2017 0910    INR No results found for: INR   Intake/Output Summary (Last 24 hours) at 05/06/2021 0724 Last data filed at 05/06/2021 0100 Gross per 24 hour  Intake 4020.17 ml  Output 1945 ml  Net 2075.17 ml     Assessment/Plan:  75 y.o. female is s/p:  Anterior spine exposure of the L5-S1 disc space via anterior retroperitoneal approach for L5-S1 ALIF  1 Day Post-Op   -pt doing well this morning with palpable pedal pulses -passing flatus -incision with honeycomb dresssing and clean --doing well from vascular standpoint-follow up as needed   Doreatha Massed, PA-C [facility] [phone] 05/06/2021 7:24 AM  I have seen and evaluated the patient. I agree with the PA note as documented above.  Postop day 1 status post L5-S1 ALIF.  Abdomen is nice and soft.  She has palpable left DP  pulse.  Overall looks good from our standpoint.  Unfortunately question of new expressive dysphagia and MRI of the brain today with stroke protocol.  Cephus Shelling, MD [facility] Office: [phone]

## 2021-05-06 NOTE — Evaluation (Addendum)
Occupational Therapy Evaluation Patient Details Name: Tammy Hicks MRN: [id number] DOB: 04/06/1946 Today's Date: 05/06/2021    History of Present Illness 75 yo female ALIF L5-S1 anterior retroperitoneal approach anterior lateral L1-2, and posterior screws with pelvic fixation with iliac screwsPMH anxiety, CKDIII DM Depression, migraines, Hx kidney stones, HTN, OA, insomnia, PONV, restless leg syndrom Sleep apnea uses Cpap back surgery x2,   Clinical Impression   Patient is s/p ALIF L5-S1 surgery resulting in functional limitations due to the deficits listed below (see OT problem list). Pt currently with decreased BP limiting progression toward OT goals. Pt returned to supine due to low BP at this time.  Patient will benefit from skilled OT acutely to increase independence and safety with ADLS to allow discharge CIR.     Follow Up Recommendations  CIR    Equipment Recommendations  3 in 1 bedside commode;Other (comment) (RW)    Recommendations for Other Services Rehab consult     Precautions / Restrictions Precautions Precautions: Back Precaution Comments: reviewed back precautions with poor recall Required Braces or Orthoses: Spinal Brace Spinal Brace: Lumbar corset;Applied in sitting position      Mobility Bed Mobility Overal bed mobility: Needs Assistance             General bed mobility comments: pt total +4 (A) rolling totla+2 total (A) for removal of back brace and sheets. pt provided a pillow for comfort on abdomen incision    Transfers Overall transfer level: Needs assistance Equipment used: Rolling walker (2 wheeled) Transfers: Sit to/from Stand Sit to Stand: Mod assist         General transfer comment: requires (A) to power up    Balance Overall balance assessment: Needs assistance Sitting-balance support: Bilateral upper extremity supported;Feet supported Sitting balance-Leahy Scale: Fair     Standing balance support: Bilateral upper extremity  supported Standing balance-Leahy Scale: Fair                             ADL either performed or assessed with clinical judgement   ADL Overall ADL's : Needs assistance/impaired Eating/Feeding: Maximal assistance;Sitting Eating/Feeding Details (indicate cue type and reason): due to attention     Upper Body Bathing: Maximal assistance   Lower Body Bathing: Total assistance   Upper Body Dressing : Maximal assistance   Lower Body Dressing: Total assistance                 General ADL Comments: pt with decreased BP in the chair so total+4 back to bed this session with drop arm chair / sheet. spouse present entire session. Rn at bed side to address     Vision Baseline Vision/History: Wears glasses Wears Glasses: Reading only       Perception     Praxis      Pertinent Vitals/Pain Pain Assessment: Faces Faces Pain Scale: Hurts even more Pain Location: belly Pain Descriptors / Indicators: Discomfort;Grimacing;Guarding Pain Intervention(s): Monitored during session;Premedicated before session;Repositioned     Hand Dominance Right   Extremity/Trunk Assessment Upper Extremity Assessment Upper Extremity Assessment: Overall WFL for tasks assessed       Cervical / Trunk Assessment Cervical / Trunk Assessment: Other exceptions (s/p surg and x2 previous surgeries)   Communication Communication Communication: No difficulties   Cognition Arousal/Alertness: Awake/alert Behavior During Therapy: Flat affect Overall Cognitive Status: Impaired/Different from baseline Area of Impairment: Orientation;Attention;Memory;Following commands;Safety/judgement;Awareness;Problem solving  Orientation Level: Disoriented to;Time;Situation Current Attention Level: Sustained Memory: Decreased recall of precautions;Decreased short-term memory Following Commands: Follows one step commands inconsistently;Follows one step commands with increased  time Safety/Judgement: Decreased awareness of safety;Decreased awareness of deficits Awareness: Intellectual Problem Solving: Slow processing;Decreased initiation;Difficulty sequencing General Comments: pt able to name spouse but unaware of how long they have been married. pt with no recall of reason for admission. pt when told states 'oh yeah thats right" pt and spouse report that pt has hx of delayed cognition after past surgeries. of note that BP was low this session with return to supine RN at bedside as well   General Comments  Bp 93/54 (62) sitting and after standing and taking step forward adn backward 71/39 (49) and after SCD applied and ankle pumps 91/48 (60) RN in room to address BP    Exercises     Shoulder Instructions      Home Living Family/patient expects to be discharged to:: Private residence Living Arrangements: Spouse/significant other;Other relatives Available Help at Discharge: Family;Available 24 hours/day Type of Home: House Home Access: Stairs to enter [employer] of Steps: 2 Entrance Stairs-Rails: None Home Layout: One level     Bathroom Shower/Tub: Walk-in shower;Tub/shower unit         Home Equipment: Walker - 2 wheels;Cane - single point;Bedside commode   Additional Comments: 3 cats ( patches, jelly bean, chloe)      Prior Functioning/Environment Level of Independence: Independent                 OT Problem List: Decreased strength;Decreased activity tolerance;Impaired balance (sitting and/or standing);Decreased cognition;Decreased safety awareness;Decreased knowledge of use of DME or AE;Decreased knowledge of precautions;Cardiopulmonary status limiting activity;Pain      OT Treatment/Interventions: Self-care/ADL training;Therapeutic exercise;DME and/or AE instruction;Energy conservation;Therapeutic activities;Cognitive remediation/compensation;Patient/family education;Balance training    OT Goals(Current goals can be found in  the care plan section) Acute Rehab OT Goals Patient Stated Goal: none stated at this time OT Goal Formulation: With patient/family Time For Goal Achievement: 05/20/21 Potential to Achieve Goals: Good  OT Frequency: Min 2X/week   Barriers to D/C:            Co-evaluation              AM-PAC OT "6 Clicks" Daily Activity     Outcome Measure Help from another person eating meals?: A Little Help from another person taking care of personal grooming?: A Little Help from another person toileting, which includes using toliet, bedpan, or urinal?: A Lot Help from another person bathing (including washing, rinsing, drying)?: A Lot Help from another person to put on and taking off regular upper body clothing?: A Little Help from another person to put on and taking off regular lower body clothing?: A Lot 6 Click Score: 15   End of Session Equipment Utilized During Treatment: Rolling walker;Back brace Nurse Communication: Mobility status;Precautions  Activity Tolerance: Treatment limited secondary to medical complications (Comment) Patient left: in bed;with call bell/phone within reach;with nursing/sitter in room;with SCD's reapplied  OT Visit Diagnosis: Unsteadiness on feet (R26.81);Muscle weakness (generalized) (M62.81);Pain                Time: 1204-1221 OT Time Calculation (min): 17 min Charges:  OT General Charges $OT Visit: 1 Visit OT Evaluation $OT Eval Moderate Complexity: 1 Mod   Brynn, OTR/L  Acute Rehabilitation Services Pager: [phone] Office: [phone] .   Mateo Flow 05/06/2021, 1:24 PM

## 2021-05-06 NOTE — Progress Notes (Signed)
EEG complete - results pending 

## 2021-05-06 NOTE — Consult Note (Addendum)
NAME:  Tammy Hicks, MRN:  [id number], DOB:  09/04/1946, LOS: 1 ADMISSION DATE:  05/05/2021, CONSULTATION DATE:  05/06/2021 REFERRING MD:  Dr. Saintclair Halsted, CHIEF COMPLAINT:  Hypotension/Confusion  History of Present Illness:  Tammy Hicks is a 75 y.o. female with PMH of HTN, hyperlipidemia, diabetes, chronic lower back pain, prior spinal surgery (2006, 2011, 2018). She was consulted to PCCM d/t confusion, and sudden hypotension and hypoxemia s/p ALIF of her L5-S1 (7/18).  Patient underwent 10 hr procedure for anterior lumbar interbody fusion of L5-S1 with an EBL of 570m. Per husband, patient has a history of waxing and waning confusion immediately following her prior surgeries that takes a couple days to fully resolve, mostly related to memory and orientation such as forgetting what an individual said or forgetting why she was in the hospital. However, those procedures were not nearly as long as her most recent surgery. He states it has never been as bad as today; for example this morning she was unable to identify what a cup is used for and had trouble interpreting a calendar, but cognitive status improved later in the day.  Patient was working with OT and was sitting when she was noted to be hypotensive and hypoxic. She remained hypotensive when OT had her lie back down in bed (BP to 70/43 and SpO2 80%). Oxygen levels shortly improved back to normal. Patient denied any dizziness during episode.  Pertinent  Medical History  -HTN -Hyperlipidemia -Diabetes -Back surgery x3 (2006, 2011, and 2018 with Dr. CSaintclair Halsted  [facility]cluding procedures, antibiotic start and stop dates in addition to other pertinent events   7/18- anterior lumbar interbody fusion (EBL: 5023m  Interim History / Subjective:  MRI brain negative for infarct or other acute finding.   Objective   Blood pressure (!) 91/58, pulse (!) 115, temperature 98.9 F (37.2 C), temperature source Oral, resp. rate (!)  27, height 5' 3" (1.6 m), weight 73 kg, SpO2 98 %.        Intake/Output Summary (Last 24 hours) at 05/06/2021 1413 Last data filed at 05/06/2021 1150 Gross per 24 hour  Intake 2378.65 ml  Output 1470 ml  Net 908.65 ml   Filed Weights   05/05/21 0544  Weight: 73 kg    Examination: General: Patient appears comfortable, lying in bed HENT: Normocephalic and atraumatic. Pupils equal, round and reactive to light. Lungs: Clear breath sounds bilaterally. Normal work of breathing. Cardiovascular: normal sinus rhythm Abdomen: Nondistended Extremities: No edema. Neuro: Awake, alert and oriented to self, location, and reason for being in the hospital. Not oriented to date, month, or year. Able to list months of the year forward with initial help. Unable to list months of the year backwards. CN 2-12 intact. Motor strength 5/5 in upper extremities bilaterally and 4+/5 in lower extremities. Bicep and brachioradialis reflex 2+ bilaterally. Patellar reflex absent.  Resolved Hospital Problem list     Assessment & Plan:  Confusion: Waxing and waning picture in setting of prolonged surgical anesthesia and history of post-operative delirium following prior surgeries makes POD towards the top of the differential diagnosis. Patient has also had low blood pressure following her procedure, and given her history of hypertension and hyperlipidemia, I would also consider a brain perfusion related etiology. MRI brain did not show evidence of an infarct, although an ischemic stroke may not appear on MRI acutely. May consider angiography and neuro consult in future if patient's waxing/waning confusion persists.   Hypotension: Patient has hx  of HTN. Her BP since surgery 7/18 has been between 073-710 systolic. She started irbesartan (Avapro) and hydrochlorothiazide on 7/19 AM after which her BP dropped to the 62'I systolic. Suspect this was due to her medications in the setting of possible low volume status. Will hold  her irbesartan and hydrochlorothiazide for now. -d/c irbesartan (Avapro) 300 mg -d/c hydrochlorothiazide (Hydrodiuril) 25 mg  Hypoxemia: Patient's SpO2 dropped while patient worked with OT. Patient's SpO2 also dropped briefly to the 70's during my physical exam and had irregular waveforms on the monitor before returning to to high 90's with normal waveform. Suspect this was due to machine error and not a true reading of hypoxemia.  Normocytic Anemia: Hemoglobin at 7.5 following surgery yesterday. Will transfuse 1 U PRBC given concern for brain perfusion in setting of altered mental status.  Best Practice (right click and "Reselect all SmartList Selections" daily)   Diet/type: Regular consistency (see orders) DVT prophylaxis: SCD GI prophylaxis: PPIProtonix 40 mg qd Lines: Central line Foley:  Yes, and it is still needed Code Status:  full code Last date of multidisciplinary goals of care discussion [NA]  Labs   CBC: Recent Labs  Lab 05/01/21 1122 05/05/21 1017 05/05/21 1021 05/05/21 1340 05/05/21 1345 05/05/21 1830 05/06/21 1230  WBC 9.0  --   --   --   --  17.1* 9.2  NEUTROABS  --   --   --   --   --   --  7.2  HGB 11.8*   < > 10.2* 9.9* 10.5* 9.4* 7.5*  HCT 36.4   < > 30.0* 29.0* 31.0* 28.0* 22.9*  MCV 91.7  --   --   --   --  91.2 93.1  PLT 354  --   --   --   --  252 209   < > = values in this interval not displayed.    Basic Metabolic Panel: Recent Labs  Lab 05/01/21 1122 05/05/21 1017 05/05/21 1021 05/05/21 1340 05/05/21 1345 05/05/21 1830 05/06/21 1230  NA 136 139 138 138 138 136 138  K 3.8 3.7 3.7 3.7 3.7 4.2 3.8  CL 104 106  --  106  --  107 107  CO2 25  --   --   --   --  20* 23  GLUCOSE 81 127*  --  174*  --  179* 149*  BUN 21 20  --  19  --  18 20  CREATININE 1.03* 0.80  --  0.80  --  0.90 1.25*  CALCIUM 9.6  --   --   --   --  8.9 8.5*   GFR: Estimated Creatinine Clearance: 37.8 mL/min (A) (by C-G formula based on SCr of 1.25 mg/dL (H)). Recent  Labs  Lab 05/01/21 1122 05/05/21 1830 05/06/21 1230  WBC 9.0 17.1* 9.2    Liver Function Tests: Recent Labs  Lab 05/06/21 1230  AST 23  ALT 10  ALKPHOS 32*  BILITOT 0.8  PROT 5.2*  ALBUMIN 3.2*   No results for input(s): LIPASE, AMYLASE in the last 168 hours. No results for input(s): AMMONIA in the last 168 hours.  ABG    Component Value Date/Time   PHART 7.322 (L) 05/05/2021 1345   PCO2ART 42.1 05/05/2021 1345   PO2ART 148 (H) 05/05/2021 1345   HCO3 21.7 05/05/2021 1345   TCO2 23 05/05/2021 1345   ACIDBASEDEF 4.0 (H) 05/05/2021 1345   O2SAT 99.0 05/05/2021 1345     Coagulation Profile: No results  for input(s): INR, PROTIME in the last 168 hours.  Cardiac Enzymes: No results for input(s): CKTOTAL, CKMB, CKMBINDEX, TROPONINI in the last 168 hours.  HbA1C: Hgb A1c MFr Bld  Date/Time Value Ref Range Status  05/01/2021 11:22 AM 6.0 (H) 4.8 - 5.6 % Final    Comment:    (NOTE) Pre diabetes:          5.7%-6.4%  Diabetes:              >6.4%  Glycemic control for   <7.0% adults with diabetes   06/14/2017 09:10 AM 6.3 (H) 4.8 - 5.6 % Final    Comment:    (NOTE) Pre diabetes:          5.7%-6.4% Diabetes:              >6.4% Glycemic control for   <7.0% adults with diabetes     CBG: Recent Labs  Lab 05/05/21 0542 05/05/21 1754 05/06/21 0138 05/06/21 0732 05/06/21 1148  GLUCAP 94 168* 133* 91 156*    Review of Systems:   See above in HPI  Past Medical History:  She,  has a past medical history of Anxiety, Chronic kidney disease, Depression, Diabetes mellitus without complication ([place]), Family history of adverse reaction to anesthesia, GERD (gastroesophageal reflux disease), Headache, History of kidney stones, Hyperlipidemia, Hypertension, Insomnia, Osteoarthritis, PONV (postoperative nausea and vomiting), Restless leg syndrome, and Sleep apnea.   Surgical History:   Past Surgical History:  Procedure Laterality Date   ABDOMINAL HYSTERECTOMY      BACK SURGERY     x2    BREAST SURGERY     Fibroid tumors removed   CESAREAN SECTION     COLONOSCOPY     x2   LITHOTRIPSY     "several"   TONSILLECTOMY       Social History:   reports that she has never smoked. She has never used smokeless tobacco. She reports that she does not drink alcohol and does not use drugs.   Family History:  Her family history is not on file.   Allergies Allergies  Allergen Reactions   Pioglitazone Swelling and Other (See Comments)    Leg swelling Fatigue AKA: ACTOS  Leg swelling   Primidone Palpitations, Shortness Of Breath and Other (See Comments)    DYSPNEA   Flexeril [Cyclobenzaprine] Other (See Comments)    Confusion   Codeine Nausea Only and Nausea And Vomiting    Severe nausea      Home Medications  Prior to Admission medications   Medication Sig Start Date End Date Taking? Authorizing Provider  acetaminophen (TYLENOL) 500 MG tablet Take 1,000-1,500 mg by mouth every 4 (four) hours as needed for moderate pain.   Yes [provider]  azelastine (ASTELIN) 0.1 % nasal spray Place 2 sprays into both nostrils 2 (two) times daily as needed for rhinitis or allergies. 09/30/17  Yes [provider]  desvenlafaxine (PRISTIQ) 50 MG 24 hr tablet Take 50 mg by mouth daily. 05/07/17  Yes [provider]  gabapentin (NEURONTIN) 100 MG capsule Take 100 mg by mouth 3 (three) times daily. 04/30/17  Yes [provider]  glipiZIDE (GLUCOTROL XL) 5 MG 24 hr tablet Take 5 mg by mouth 2 (two) times daily. 03/24/21  Yes [provider]  hydrOXYzine (ATARAX/VISTARIL) 10 MG tablet Take 10 mg by mouth every 8 (eight) hours as needed for anxiety.   Yes [provider]  insulin regular (NOVOLIN R) 100 units/mL injection Inject 2-8 Units  into the skin 3 (three) times daily as needed for high blood sugar. 03/09/17  Yes [provider]  metFORMIN (GLUCOPHAGE) 500 MG tablet Take 500 mg by mouth 2 (two) times daily  with a meal.   Yes [provider]  olmesartan-hydrochlorothiazide (BENICAR HCT) 40-25 MG tablet Take 1 tablet by mouth daily.   Yes [provider]  omeprazole (PRILOSEC) 20 MG capsule Take 20 mg by mouth daily. 12/08/16  Yes [provider]  oxyCODONE-acetaminophen (PERCOCET) 10-325 MG tablet Take 1 tablet by mouth every 4 (four) hours as needed for pain.   Yes [provider]  potassium chloride SA (KLOR-CON) 20 MEQ tablet Take 20 mEq by mouth 2 (two) times daily. 01/23/21  Yes [provider]  pramipexole (MIRAPEX) 1 MG tablet Take 1 mg by mouth at bedtime.   Yes [provider]  pravastatin (PRAVACHOL) 10 MG tablet Take 10 mg by mouth daily.   Yes [provider]  sitaGLIPtin (JANUVIA) 100 MG tablet Take 100 mg by mouth daily. 06/07/17  Yes [provider]  traMADol (ULTRAM) 50 MG tablet Take 50-100 mg by mouth every 6 (six) hours as needed for severe pain. 02/12/21  Yes [provider]  Vitamin D, Ergocalciferol, (DRISDOL) 1.25 MG (50000 UNIT) CAPS capsule Take 50,000 Units by mouth once a week. 03/29/21  Yes [provider]  ACETAMINOPHEN-BUTALBITAL 50-325 MG TABS Take 1 tablet by mouth every 4 (four) hours as needed (headache).    [provider]  aspirin 81 MG EC tablet Take 81 mg by mouth daily.    [provider]  Blood Glucose Monitoring Suppl (ONE TOUCH ULTRA 2) w/Device KIT Use as instructed. Check blood sugar 2 times per day. Dx. E11.9 01/05/19   [provider]  Estradiol 10 MCG TABS vaginal tablet Place 1 tablet vaginally 2 (two) times a week. 01/12/21   [provider]  fluticasone (FLONASE) 50 MCG/ACT nasal spray Place 1 spray into both nostrils daily as needed for allergies. 10/16/15   [provider]  furosemide (LASIX) 40 MG tablet Take 40 mg by mouth daily as needed for fluid.    [provider]  Insulin Syringe-Needle U-100 (B-D INS SYR  ULTRAFINE 1CC/30G) 30G X 1/2" 1 ML MISC See admin instructions. 10/17/19   [provider]  Insulin Syringe-Needle U-100 25G X 1" 1 ML MISC Use 3 times a day as needed 03/09/17   [provider]  meclizine (ANTIVERT) 25 MG tablet Take 25 mg by mouth every 6 (six) hours as needed for dizziness.    [provider]  OneTouch Delica Lancets 28Z MISC 1 each by Other route 2 times daily. Dx E11.9 02/22/15   [provider]  zolpidem (AMBIEN CR) 12.5 MG CR tablet Take 12.5 mg by mouth at bedtime as needed for sleep.    [provider]    Kemper Durie, M.S. Medical Student [facility]  Critical care time: 35 minutes     ---------------------------------  Attending note: I have seen and examined the patient. History, labs and imaging reviewed.  75 year old with history of hypertension, hyperlipidemia, diabetes admitted for anterior lumbar fusion of M6-Q9, complicated by postop delirium, confusion with negative CT and MRI.  PCCM called for sudden onset of hypotension and hypoxia while working with PT/OT.  Blood pressure (!) 89/70, pulse (!) 103, temperature 99 F (37.2 C), temperature source Oral, resp. rate 13, height 5' 3" (1.6 m), weight 73 kg, SpO2 99 %.  Gen:      No acute distress, chronically ill-appearing HEENT:  EOMI, sclera anicteric Neck:     No masses; no thyromegaly Lungs:    Clear to auscultation bilaterally; normal respiratory effort CV:         Regular rate and rhythm; no murmurs Abd:      + bowel sounds; soft, non-tender; no palpable masses, no distension Ext:    No edema; adequate peripheral perfusion Skin:      Warm and dry; no rash Neuro: alert and oriented x 3 Psych: normal mood and affect   Labs/Imaging personally reviewed, significant for Hemoglobin dropped to 7.5, platelets 209 Chest x-ray reviewed with no acute abnormalities  Assessment/plan: Postop delirium Normal CT, MRI Continue monitoring,  reorientation, limit sedating medications EEG already ordered  Hypotension, hypoxia May be volume depleted versus vasovagal, low hemoglobin noted from blood loss CTA already ordered by primary team Suspect the hypoxia may be related to low perfusion and faulty pleth reading Transfuse 1 unit PRBC Hold HTN meds  The patient is critically ill with multiple organ system failure and requires high complexity decision making for assessment and support, frequent evaluation and titration of therapies, advanced monitoring, review of radiographic studies and interpretation of complex data.   Critical Care Time devoted to patient care services, exclusive of separately billable procedures, described in this note is 45 minutes.   Marshell Garfinkel MD [facility] See Amion for pager  If no response to pager , please call [phone] until 7pm After 7:00 pm call Elink  [phone] 05/06/2021, 5:59 PM

## 2021-05-06 NOTE — Progress Notes (Signed)
Orthopedic Tech Progress Note Patient Details:  PEGGI YONO [birth year] [id number]  RN stated " patient has brace"    Patient ID: Twana First, female   DOB: November 15, 1945, 75 y.o.   MRN: [id number]  Donald Pore 05/06/2021, 12:44 PM

## 2021-05-06 NOTE — Consult Note (Addendum)
Stroke Neurology Consultation Note  Consult Requested by: Dr. Wynetta Emery  Reason for Consult: post operative mental status changes  Consult Date:  05/06/21  History of Present Illness:  Tammy Hicks is an 75 y.o. Caucasian female with hypertension, hyperlipidemia, diabetes, anxiety/depression, baseline mild MCI, and lumbar stenosis who was admitted to the neurosurgery service on 05/05/21 for spinal fusion. She underwent the procedure without major incident.  She was noted to be having some speech difficulties this morning and neurology was consulted for further evaluation.   History primarily obtained from patient's husband who was at bedside. He notes that she experiences some waxing and waning memory changes at baseline. He notes that she was mentating ok this morning when he first arrived however developed some difficulty with word finding as the morning went on. He reports that this improved later on and seemed to be mentating ok prior to our team's arrival but now appears to have gotten worse again during the visit..   On discussion with Tammy Hicks, she reports just not feeling well, but could not characterize this further. She was able to state her name without difficulty. When asked to name a cup, she was unable to do so and starting saying "I just don't know, I just don't know". She expressed frustration with being unable to recall the name.  There is also perseveration of ideas. Patient has no prior history of strokes, TIAs, seizures or significant neurological problems.  CT scan of the head shows mild age-related changes of small vessel disease and generalized atrophy.  No acute findings.  MRI scan of the brain was also obtained which is suboptimal due to motion artifact but shows no acute stroke and only mild changes of chronic small vessel disease and generalized atrophy   Past Medical History:  Diagnosis Date   Anxiety    Chronic kidney disease    Stage 3 CKD   Depression    Diabetes mellitus  without complication (HCC)    Family history of adverse reaction to anesthesia    "my sister gets really sick afterwards"   GERD (gastroesophageal reflux disease)    Headache    migraines   History of kidney stones    Hyperlipidemia    Hypertension    Insomnia    Osteoarthritis    PONV (postoperative nausea and vomiting)    "progresses to migraine"   Restless leg syndrome    Sleep apnea    uses CPAP     Past Surgical History:  Procedure Laterality Date   ABDOMINAL HYSTERECTOMY     BACK SURGERY     x2    BREAST SURGERY     Fibroid tumors removed   CESAREAN SECTION     COLONOSCOPY     x2   LITHOTRIPSY     "several"   TONSILLECTOMY      History reviewed. No pertinent family history.   Social History:  reports that she has never smoked. She has never used smokeless tobacco. She reports that she does not drink alcohol and does not use drugs.  Review of Systems: A full ROS was attempted today and was  able to be performed.  Systems assessed include - Constitutional, Eyes, HENT, Respiratory, Cardiovascular, Gastrointestinal, Genitourinary, Integument/breast, Hematologic/lymphatic, Musculoskeletal, Neurological, Behavioral/Psych, Endocrine, Allergic/Immunologic - with pertinent responses as per HPI.  Allergies:  Allergies  Allergen Reactions   Pioglitazone Swelling and Other (See Comments)    Leg swelling Fatigue AKA: ACTOS  Leg swelling   Primidone Palpitations, Shortness Of Breath  and Other (See Comments)    DYSPNEA   Flexeril [Cyclobenzaprine] Other (See Comments)    Confusion   Codeine Nausea Only and Nausea And Vomiting    Severe nausea      No current facility-administered medications on file prior to encounter.   Current Outpatient Medications on File Prior to Encounter  Medication Sig Dispense Refill   acetaminophen (TYLENOL) 500 MG tablet Take 1,000-1,500 mg by mouth every 4 (four) hours as needed for moderate pain.     olmesartan-hydrochlorothiazide  (BENICAR HCT) 40-25 MG tablet Take 1 tablet by mouth daily.     oxyCODONE-acetaminophen (PERCOCET) 10-325 MG tablet Take 1 tablet by mouth every 4 (four) hours as needed for pain.     pramipexole (MIRAPEX) 1 MG tablet Take 1 mg by mouth at bedtime.     pravastatin (PRAVACHOL) 10 MG tablet Take 10 mg by mouth daily.     furosemide (LASIX) 40 MG tablet Take 40 mg by mouth daily as needed for fluid.     meclizine (ANTIVERT) 25 MG tablet Take 25 mg by mouth every 6 (six) hours as needed for dizziness.     zolpidem (AMBIEN CR) 12.5 MG CR tablet Take 12.5 mg by mouth at bedtime as needed for sleep.       Test Results: CBC:  Recent Labs  Lab 05/05/21 1830 05/06/21 1230  WBC 17.1* 9.2  NEUTROABS  --  7.2  HGB 9.4* 7.5*  HCT 28.0* 22.9*  MCV 91.2 93.1  PLT 252 209   Basic Metabolic Panel:  Recent Labs  Lab 05/05/21 1830 05/06/21 1230  NA 136 138  K 4.2 3.8  CL 107 107  CO2 20* 23  GLUCOSE 179* 149*  BUN 18 20  CREATININE 0.90 1.25*  CALCIUM 8.9 8.5*   Liver Function Tests: Recent Labs  Lab 05/06/21 1230  AST 23  ALT 10  ALKPHOS 32*  BILITOT 0.8  PROT 5.2*  ALBUMIN 3.2*   No results for input(s): LIPASE, AMYLASE in the last 168 hours. No results for input(s): AMMONIA in the last 168 hours. Coagulation Studies: No results for input(s): LABPROT, INR in the last 72 hours. Cardiac Enzymes: No results for input(s): CKTOTAL, CKMB, CKMBINDEX, TROPONINI in the last 168 hours. BNP: Invalid input(s): POCBNP CBG:  Recent Labs  Lab 05/05/21 1754 05/06/21 0138 05/06/21 0732 05/06/21 1148 05/06/21 1534  GLUCAP 168* 133* 91 156* 86   Urinalysis:  Recent Labs  Lab 05/06/21 1139  COLORURINE YELLOW  LABSPEC 1.016  PHURINE 5.0  GLUCOSEU NEGATIVE  HGBUR SMALL*  BILIRUBINUR NEGATIVE  KETONESUR NEGATIVE  PROTEINUR NEGATIVE  NITRITE NEGATIVE  LEUKOCYTESUR TRACE*   Microbiology:  Results for orders placed or performed during the hospital encounter of 05/01/21  Surgical  pcr screen     Status: None   Collection Time: 05/01/21 12:49 PM   Specimen: Nasal Mucosa; Nasal Swab  Result Value Ref Range Status   MRSA, PCR NEGATIVE NEGATIVE Final   Staphylococcus aureus NEGATIVE NEGATIVE Final    Comment: (NOTE) The Xpert SA Assay (FDA approved for NASAL specimens in patients 52 years of age and older), is one component of a comprehensive surveillance program. It is not intended to diagnose infection nor to guide or monitor treatment. Performed at [facility], [address] [place], [place] [address]   SARS CORONAVIRUS 2 (TAT 6-24 HRS) Nasopharyngeal Nasopharyngeal Swab     Status: None   Collection Time: 05/01/21 12:50 PM   Specimen: Nasopharyngeal Swab  Result Value Ref Range Status   SARS Coronavirus 2 NEGATIVE NEGATIVE Final    Comment: (NOTE) SARS-CoV-2 target nucleic acids are NOT DETECTED.  The SARS-CoV-2 RNA is generally detectable in upper and lower respiratory specimens during the acute phase of infection. Negative results do not preclude SARS-CoV-2 infection, do not rule out co-infections with other pathogens, and should not be used as the sole basis for treatment or other patient management decisions. Negative results must be combined with clinical observations, patient history, and epidemiological information. The expected result is Negative.  Fact Sheet for Patients: HairSlick.nohttps://www.fda.gov/media/138098/download  Fact Sheet for Healthcare Providers: quierodirigir.comhttps://www.fda.gov/media/138095/download  This test is not yet approved or cleared by the [place] FDA and  has been authorized for detection and/or diagnosis of SARS-CoV-2 by FDA under an Emergency Use Authorization (EUA). This EUA will remain  in effect (meaning this test can be used) for the duration of the COVID-19 declaration under Se ction 564(b)(1) of the Act, 21 U.S.C. section 360bbb-3(b)(1), unless the authorization is terminated or revoked sooner.  Performed at [facility], [address] [place], [place] [address]    Lipid Panel: No results found for: CHOL, TRIG, HDL, CHOLHDL, VLDL, LDLCALC HgbA1c:  Lab Results  Component Value Date   HGBA1C 6.0 (H) 05/01/2021   Urine Drug Screen: No results found for: LABOPIA, COCAINSCRNUR, LABBENZ, AMPHETMU, THCU, LABBARB  Alcohol Level: No results for input(s): ETH in the last 168 hours.  DG Lumbar Spine 2-3 Views  Result Date: 05/05/2021 CLINICAL DATA:  Surgical fusion of L1-2. EXAM: LUMBAR SPINE - 2-3 VIEW COMPARISON:  None. FINDINGS: Three intraoperative fluoroscopic images were obtained of the upper lumbar spine. These images demonstrate the patient be status post surgical posterior fusion of L1-2 with bilateral intrapedicular screw placement interbody fusion. IMPRESSION: Fluoroscopic guidance provided during surgical fusion of L1-2. Electronically Signed   By: Lupita RaiderJames  Green Jr M.D.   On: 05/05/2021 13:07   CT HEAD WO CONTRAST  Result Date: 05/05/2021 CLINICAL DATA:  Mental status change, unknown cause. Additional history provided: Mental status change status post lumbar surgery today. EXAM: CT HEAD WITHOUT CONTRAST TECHNIQUE: Contiguous axial images were obtained from the base of the skull through the vertex without intravenous contrast. COMPARISON:  No pertinent prior exams available for comparison. FINDINGS: Brain: Mild generalized cerebral atrophy. There is no acute intracranial hemorrhage. No demarcated cortical infarct. No extra-axial fluid collection. No evidence of an intracranial mass. No midline shift. Partially empty sella turcica. Vascular: No hyperdense vessel. Skull: No calvarial fracture or focal suspicious osseous lesion. Sinuses/Orbits: Visualized orbits show no acute finding. Trace mucosal thickening within the bilateral ethmoid and left maxillary sinuses at the imaged levels. Small fluid levels within the bilateral sphenoid sinuses. IMPRESSION: No evidence of acute intracranial abnormality.  Mild generalized cerebral atrophy. Paranasal sinus disease, as described. Electronically Signed   By: Jackey LogeKyle  Golden DO   On: 05/05/2021 20:28   MR BRAIN WO CONTRAST  Result Date: 05/06/2021 CLINICAL DATA:  Neuro deficit with acute stroke suspected EXAM: MRI HEAD WITHOUT CONTRAST TECHNIQUE: Multiplanar, multiecho pulse sequences of the brain and surrounding structures were obtained without intravenous contrast. COMPARISON:  Yesterday FINDINGS: Brain: No acute infarction, hemorrhage, hydrocephalus, extra-axial collection or mass lesion. Vascular: Normal flow voids. Skull and upper cervical spine: Normal marrow signal. Sinuses/Orbits: Negative. Other: Progressively motion degraded scan with multiple nondiagnostic images. IMPRESSION: 1. Significantly motion degraded study. 2. Negative for infarct or other acute finding. Electronically Signed   By: Marnee SpringJonathon  Watts  M.D.   On: 05/06/2021 11:40   DG CHEST PORT 1 VIEW  Result Date: 05/06/2021 CLINICAL DATA:  Shortness of breath. EXAM: PORTABLE CHEST 1 VIEW COMPARISON:  Single-view of the chest 05/05/2021. FINDINGS: Right IJ central venous catheter is unchanged. Lungs are clear. Heart size is normal. No pneumothorax or pleural fluid. No acute or focal bony abnormality. IMPRESSION: No acute disease. Electronically Signed   By: Drusilla Kanner M.D.   On: 05/06/2021 13:45   DG CHEST PORT 1 VIEW  Result Date: 05/05/2021 CLINICAL DATA:  Central venous catheter placement EXAM: PORTABLE CHEST 1 VIEW COMPARISON:  08/12/2010 FINDINGS: Right internal jugular central venous catheter is in place with its tip at the superior cavoatrial junction. Lungs are clear. No pneumothorax or pleural effusion. Cardiac size within normal limits. Pulmonary vascularity is normal. Surgical drainage catheter overlies the epigastrium and right upper quadrant. IMPRESSION: Central venous catheter tip within the superior cavoatrial junction. No pneumothorax. Electronically Signed   By: Helyn Numbers MD   On: 05/05/2021 18:29   DG C-Arm 1-60 Min  Result Date: 05/05/2021 CLINICAL DATA:  Lumbar fusion. EXAM: DG C-ARM 1-60 MIN CONTRAST:  None. FLUOROSCOPY TIME:  Number of Acquired Spot Images: 3. COMPARISON:  None. FINDINGS: Status post surgical posterior fusion of L2-3 with interbody fusion. Postsurgical changes are also seen involving L4-5 and L5-S1. Surgical clips are seen projected over the sacrum. No other radiopaque foreign body is noted. IMPRESSION: Postsurgical changes as described above. No other radiopaque foreign body seen. These results were called by telephone at the time of interpretation on 05/05/2021 at 12:23 pm to Jess in OR 21, who verbally acknowledged these results. Electronically Signed   By: Lupita Raider M.D.   On: 05/05/2021 12:24   DG OR LOCAL ABDOMEN  Result Date: 05/05/2021 CLINICAL DATA:  L5-S1 fusion. Protocol assessment for retained foreign body. EXAM: OR LOCAL ABDOMEN COMPARISON:  06/24/2017 FINDINGS: One view portable AP film of the lumbar spine shows posterior fusion hardware at L2-3 with spinous process fixation devices at L3-4 and L4-5. Anterior fusion hardware visible now at L5-S1. No evidence for unexpected soft tissue foreign body. IMPRESSION: No unexpected foreign body. Report was called directly to OR nurse, Verdon Cummins, at 1029 hours on 05/05/2021. Electronically Signed   By: Kennith Center M.D.   On: 05/05/2021 10:29     EEG shows triphasic waves and is consistent with encephalopathy  Physical Examination: Temp:  [97.9 F (36.6 C)-99.6 F (37.6 C)] 98 F (36.7 C) (07/19 1530) Pulse Rate:  [68-121] 98 (07/19 1530) Resp:  [8-36] 19 (07/19 1530) BP: (70-124)/(39-87) 103/47 (07/19 1530) SpO2:  [80 %-100 %] 99 % (07/19 1530) Arterial Line BP: (131)/(77) 131/77 (07/18 1750)  General - chronically ill appearing in NAD.Marland Kitchen  She is wearing a back brace for following her recent back surgery  Ophthalmologic - no scleral injection or icterus  Cardiovascular -  Regular rate and rhythm per tele.  Mental Status -  Alert. Oriented to person and place.  Diminished attention, registration and recall.  Easy distractibility. Speech: no dysarthria. Intermittent difficulty with word recall.  Perseverance of thought.  No paraphasic errors.  Difficulty with naming and repetition and comprehension  Cranial Nerves II - XII - II - peripheral vision diminished bilaterally. III, IV, VI - Extraocular movements intact. V - Facial sensation intact bilaterally. VII - Facial movement intact bilaterally. VIII - Hearing & vestibular intact bilaterally. X - Palate elevates symmetrically. XI - Chin turning & shoulder shrug intact  bilaterally. XII - Tongue protrusion midline.  Motor Strength - The patient's strength was normal in all extremities and pronator drift was absent.  Bulk was normal and fasciculations were absent.   Motor Tone - Muscle tone was assessed of appendages was normal. Sensory - Light touch, temperature/pinprick were assessed and were symmetrical.   Coordination - The patient had normal movements in the hands and feet with no ataxia or dysmetria. Mild asterixis bilaterally   Gait and Station - deferred.   Assessment:  LICIA HARL is an 75 y.o. Caucasian female with hypertension, hyperlipidemia, diabetes, anxiety/depression, baseline mild MCI, and lumbar stenosis who was admitted to the neurosurgery service on 05/05/21 for spinal fusion. She was noted to develop some word finding difficulties post operatively, and neurology was consulted for further evaluation  Altered mental status postsurgery likely multifactorial due to post operative delirium, baseline MCI, polypharmacy.. Suspect that her word finding difficulty is an exacerbation of her baseline MCI in the setting of recent surgery. Chart review indicates prior history of similar events in the past that occurred post op. The length of the procedure she underwent yesterday was longer than in those  prior episodes, likely making these more pronounced.  No ischemic changes on CT and MRI. CT shows generalized cerebral atrophy, consistent with MCI.  On medication review, she is on a number of centrally acting medications including pristiq, gabapentin, hydroxazine, perocet, mirapex, and ambien. These, especially in combination with anesthesia from yesterday and her age, are likely playing a role in her changes as well. No seizure on EEG.  And findings can consistent with encephalopathy B12, TSH normal.  Plan: -Recommend holding ambien, hydroxazine, and gabapentin. -also has oxycodone, percocet, tramadol, and dilaudid on her med list right now. Consider reducing available opioids. -f/u RPR and homocysteine  Chronic anemia. 2g drop from yesterday. Now 7.5. no obvious source of bleed.  Type 2 diabetes mellitus. A1C 6. Hx of hypertension. Developed hypotension this afternoon. Labs suggest pre-prenal AKI. Likely in the post operative setting -management per primary  Hospital day # 1   Thank you for this consultation and allowing [place] to participate in the care of this patient.   To contact Stroke Continuity provider, please refer to [email or web address]. After hours, contact General Neurology    Elige Radon, MD Internal Medicine Resident PGY-3 [facility] Pager: [phone] 05/06/2021 4:43 PM    Stroke MD note:  Patient developed altered mental status with mostly speech and word finding difficulties however she is also had fluctuation in speech with increased confusion and disorientation which is likely from multifactorial encephalopathy in the postop situation due to combination of baseline cognitive impairment with prolonged surgery and anesthesia effect as well as being on polypharmacy.  MRI is negative for acute stroke and EEG shows triphasic waves compatible with encephalopathy and no definite seizures.  Recommend minimize CNS active medications and reduce pain  medications as tolerated.  .  Patient has had similar episodes as per husband in the past following her surgeries and the current episode seems more severe and is likely going to be prolonged due to the longer duration of her surgery and exposure to anesthesia.  Expect slow improvement over the next few days.  Discussed with Dr. Wynetta Emery. This patient is critically ill and at significant risk of neurological worsening, death and care requires constant monitoring of vital signs, hemodynamics,respiratory and cardiac monitoring, extensive review of multiple databases, frequent neurological assessment, discussion with family, other specialists and medical decision making  of high complexity.I have made any additions or clarifications directly to the above note.This critical care time does not reflect procedure time, or teaching time or supervisory time of PA/NP/Med Resident etc but could involve care discussion time.  I spent 30 minutes of neurocritical care time  in the care of  this patient.    Delia Heady, MD

## 2021-05-06 NOTE — Procedures (Signed)
Patient Name: Tammy Hicks  MRN: [id number]  Epilepsy Attending: Charlsie Quest  Referring Physician/Provider: Dr Elige Radon Date: 05/06/2021 Duration: 23.48 mins  Patient history: 75 year old female with new speech disturbance.  EEG to evaluate for seizures.  Level of alertness: awake  AEDs during EEG study: GBP  Technical aspects: This EEG study was done with scalp electrodes positioned according to the 10-20 International system of electrode placement. Electrical activity was acquired at a sampling rate of 500Hz  and reviewed with a high frequency filter of 70Hz  and a low frequency filter of 1Hz . EEG data were recorded continuously and digitally stored.   Description: No clear posterior dominant rhythm was seen. EEG showed continuous generalized slowing. Intermittent Generalized periodic discharges with triphasic morphology at 1Hz  were also noted.  Hyperventilation and photic stimulation were not performed.     ABNORMALITY - Periodic discharges with triphasic morphology, generalized ( GPDs) - Continuous slow, generalized  IMPRESSION: This study showed generalized periodic discharges with triphasic morphology at 1 Hz which can on the ictal-interictal continuum with low potential for seizures.  Although, this semiology and morphology is more commonly seen in toxic-metabolic causes. Additionally there is moderate diffuse encephalopathy, nonspecific etiology.  No seizures were seen throughout the recording.   [name] 

## 2021-05-06 NOTE — Progress Notes (Signed)
Subjective: Patient reports  denies any complaints condition of back pain improved preoperative leg pain.  Confusion  Objective: Vital signs in last 24 hours: Temp:  [97.9 F (36.6 C)-99.6 F (37.6 C)] 99.6 F (37.6 C) (07/19 0734) Pulse Rate:  [68-121] 68 (07/19 0734) Resp:  [8-22] 14 (07/19 0734) BP: (98-124)/(57-84) 108/67 (07/19 0734) SpO2:  [90 %-100 %] 96 % (07/19 0734) Arterial Line BP: (131)/(77) 131/77 (07/18 1750)  Intake/Output from previous day: 07/18 0701 - 07/19 0700 In: 4020.2 [I.V.:2900.2; Blood:160; IV Piggyback:960] Out: 1945 [Urine:1375; Drains:120; Blood:450] Intake/Output this shift: No intake/output data recorded.  Awake alert cranial nerves intact patient was 0 out of 3 with naming appears to have an expressive or Broca's dysphagia oriented x1 strength is 5-5 lower extremities incision clean dry and intact  Lab Results: Recent Labs    05/05/21 1345 05/05/21 1830  WBC  --  17.1*  HGB 10.5* 9.4*  HCT 31.0* 28.0*  PLT  --  252   BMET Recent Labs    05/05/21 1340 05/05/21 1345 05/05/21 1830  NA 138 138 136  K 3.7 3.7 4.2  CL 106  --  107  CO2  --   --  20*  GLUCOSE 174*  --  179*  BUN 19  --  18  CREATININE 0.80  --  0.90  CALCIUM  --   --  8.9    Studies/Results: DG Lumbar Spine 2-3 Views  Result Date: 05/05/2021 CLINICAL DATA:  Surgical fusion of L1-2. EXAM: LUMBAR SPINE - 2-3 VIEW COMPARISON:  None. FINDINGS: Three intraoperative fluoroscopic images were obtained of the upper lumbar spine. These images demonstrate the patient be status post surgical posterior fusion of L1-2 with bilateral intrapedicular screw placement interbody fusion. IMPRESSION: Fluoroscopic guidance provided during surgical fusion of L1-2. Electronically Signed   By: Lupita Raider M.D.   On: 05/05/2021 13:07   CT HEAD WO CONTRAST  Result Date: 05/05/2021 CLINICAL DATA:  Mental status change, unknown cause. Additional history provided: Mental status change status  post lumbar surgery today. EXAM: CT HEAD WITHOUT CONTRAST TECHNIQUE: Contiguous axial images were obtained from the base of the skull through the vertex without intravenous contrast. COMPARISON:  No pertinent prior exams available for comparison. FINDINGS: Brain: Mild generalized cerebral atrophy. There is no acute intracranial hemorrhage. No demarcated cortical infarct. No extra-axial fluid collection. No evidence of an intracranial mass. No midline shift. Partially empty sella turcica. Vascular: No hyperdense vessel. Skull: No calvarial fracture or focal suspicious osseous lesion. Sinuses/Orbits: Visualized orbits show no acute finding. Trace mucosal thickening within the bilateral ethmoid and left maxillary sinuses at the imaged levels. Small fluid levels within the bilateral sphenoid sinuses. IMPRESSION: No evidence of acute intracranial abnormality. Mild generalized cerebral atrophy. Paranasal sinus disease, as described. Electronically Signed   By: Jackey Loge DO   On: 05/05/2021 20:28   DG CHEST PORT 1 VIEW  Result Date: 05/05/2021 CLINICAL DATA:  Central venous catheter placement EXAM: PORTABLE CHEST 1 VIEW COMPARISON:  08/12/2010 FINDINGS: Right internal jugular central venous catheter is in place with its tip at the superior cavoatrial junction. Lungs are clear. No pneumothorax or pleural effusion. Cardiac size within normal limits. Pulmonary vascularity is normal. Surgical drainage catheter overlies the epigastrium and right upper quadrant. IMPRESSION: Central venous catheter tip within the superior cavoatrial junction. No pneumothorax. Electronically Signed   By: Helyn Numbers MD   On: 05/05/2021 18:29   DG C-Arm 1-60 Min  Result Date: 05/05/2021 CLINICAL DATA:  Lumbar fusion. EXAM: DG C-ARM 1-60 MIN CONTRAST:  None. FLUOROSCOPY TIME:  Number of Acquired Spot Images: 3. COMPARISON:  None. FINDINGS: Status post surgical posterior fusion of L2-3 with interbody fusion. Postsurgical changes are  also seen involving L4-5 and L5-S1. Surgical clips are seen projected over the sacrum. No other radiopaque foreign body is noted. IMPRESSION: Postsurgical changes as described above. No other radiopaque foreign body seen. These results were called by telephone at the time of interpretation on 05/05/2021 at 12:23 pm to Jess in OR 21, who verbally acknowledged these results. Electronically Signed   By: Lupita Raider M.D.   On: 05/05/2021 12:24   DG OR LOCAL ABDOMEN  Result Date: 05/05/2021 CLINICAL DATA:  L5-S1 fusion. Protocol assessment for retained foreign body. EXAM: OR LOCAL ABDOMEN COMPARISON:  06/24/2017 FINDINGS: One view portable AP film of the lumbar spine shows posterior fusion hardware at L2-3 with spinous process fixation devices at L3-4 and L4-5. Anterior fusion hardware visible now at L5-S1. No evidence for unexpected soft tissue foreign body. IMPRESSION: No unexpected foreign body. Report was called directly to OR nurse, Verdon Cummins, at 1029 hours on 05/05/2021. Electronically Signed   By: Kennith Center M.D.   On: 05/05/2021 10:29    Assessment/Plan: Postop day 1 anterior posterior lumbar fusion L1 to the pelvis pain seems to be well managed condition of back pain no leg pain however patient appears to have a new expressive dysphagia with difficulty naming.  CT scan last night of her head was negative we will order MRI of her brain stroke protocol and contact neurology.  Patient can be mobilized with physical Occupational Therapy.  LOS: 1 day     Tammy Hicks 05/06/2021, 7:59 AM

## 2021-05-06 NOTE — Anesthesia Postprocedure Evaluation (Signed)
Anesthesia Post Note  Patient: Tammy Hicks  Procedure(s) Performed: LUMBAR FIVE-SACRAL ONE ANTERIOR LUMBAR INTERBODY FUSION (Spine Lumbar) AIRO stereotactic navigation LUMBAR ONE-TWO ANTERIOR LATERAL LUMBAR INTERBODY FUSION (Left: Spine Lumbar) PLACEMENT OF LUMBAR SCREWS WITH PELVIC FIXATION USING ILIAC SCREWS (Spine Lumbar) LEFT LUMBAR FIVE-SACRAL ONE MICRODISCECTOMY (Spine Lumbar) ABDOMINAL EXPOSURE     Patient location during evaluation: PACU Anesthesia Type: General Level of consciousness: awake and alert Pain management: pain level controlled Vital Signs Assessment: post-procedure vital signs reviewed and stable Respiratory status: spontaneous breathing, nonlabored ventilation, respiratory function stable and patient connected to nasal cannula oxygen Cardiovascular status: blood pressure returned to baseline and stable Postop Assessment: no apparent nausea or vomiting Anesthetic complications: no   No notable events documented.          Shelton Silvas

## 2021-05-07 ENCOUNTER — Encounter (HOSPITAL_COMMUNITY): Payer: Self-pay | Admitting: Neurosurgery

## 2021-05-07 DIAGNOSIS — M48061 Spinal stenosis, lumbar region without neurogenic claudication: Secondary | ICD-10-CM | POA: Diagnosis not present

## 2021-05-07 LAB — TYPE AND SCREEN
ABO/RH(D): A POS
Antibody Screen: NEGATIVE
Unit division: 0

## 2021-05-07 LAB — BPAM RBC
Blood Product Expiration Date: 202207242359
ISSUE DATE / TIME: 202207191602
Unit Type and Rh: 6200

## 2021-05-07 LAB — GLUCOSE, CAPILLARY
Glucose-Capillary: 109 mg/dL — ABNORMAL HIGH (ref 70–99)
Glucose-Capillary: 96 mg/dL (ref 70–99)
Glucose-Capillary: 130 mg/dL — ABNORMAL HIGH (ref 70–99)

## 2021-05-07 LAB — RPR: RPR Ser Ql: NONREACTIVE

## 2021-05-07 LAB — HOMOCYSTEINE: Homocysteine: 9.3 umol/L (ref 0.0–19.2)

## 2021-05-07 NOTE — Evaluation (Signed)
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) 08/15/2020   Diabetes mellitus with stage 3 chronic kidney disease (HCC) 08/15/2020   Lumbar spondylosis 06/27/2020   Benign hypertension with chronic kidney disease, stage III (HCC) 06/19/2020   Hamstring tendinitis of right thigh 12/21/2019   Tension-type headache, not intractable 11/22/2018   Primary osteoarthritis of left knee 07/26/2018   Pes anserinus bursitis of left knee 07/19/2018   Nuclear sclerotic cataract of left eye 01/25/2018   Spinal stenosis of lumbar region 06/24/2017   Varicose veins of leg with swelling, right 05/18/2017   Edema of both lower legs due to peripheral venous insufficiency 03/20/2017   Right leg swelling 01/29/2017   Lumbar degenerative disc disease 05/29/2016   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   Sacroiliitis, not elsewhere classified (HCC) 03/10/2016   Obesity (BMI 30-39.9) 12/04/2015   Risk for falls 10/16/2015   Major depressive disorder, recurrent, mild (HCC) 09/03/2015   Lumbar facet joint pain 08/22/2015   Lumbar radiculopathy 08/05/2015   Coccygodynia 12/11/2014   Neck pain 10/30/2014   Essential hematuria 07/19/2014   Spinal stenosis of lumbar region without neurogenic claudication 07/19/2014   Esophageal reflux 05/28/2014   Controlled type 2 diabetes mellitus without complication, without long-term current use of insulin (HCC) 04/20/2014   BPPV (benign paroxysmal positional vertigo) 02/21/2014    Varicose veins of legs 02/21/2014   Generalized anxiety disorder 01/31/2014   Essential hypertension 11/09/2013   Hyperlipidemia LDL goal <100 07/31/2013   Insomnia 07/31/2013   Obstructive sleep apnea 07/31/2013   Osteoarthritis 07/31/2013   Restless legs syndrome 07/31/2013   Allergic rhinitis 04/25/2013   Migraine 04/25/2013   Past Medical History:  Past Medical History:  Diagnosis Date   Anxiety    Chronic kidney disease    Stage 3 CKD   Depression    Diabetes mellitus without complication (HCC)    Family history of adverse reaction to anesthesia    "my sister gets really sick afterwards"   GERD (gastroesophageal reflux disease)    Headache    migraines   History of kidney stones    Hyperlipidemia    Hypertension    Insomnia    Osteoarthritis    PONV (postoperative nausea and vomiting)    "progresses to migraine"   Restless leg syndrome    Sleep apnea    uses CPAP   Past Surgical History:  Past Surgical History:  Procedure Laterality Date   ABDOMINAL HYSTERECTOMY     BACK SURGERY     x2    BREAST SURGERY     Fibroid tumors removed   CESAREAN SECTION     COLONOSCOPY     x2   LITHOTRIPSY     "several"   TONSILLECTOMY     HPI:  75 yo female ALIF L5-S1 anterior retroperitoneal approach, XLIF L1-2, L5-S1 lumbar laminectomy, microdiscectomy, and posterior screws with pelvic fixation with iliac screws. PMH anxiety, CKDIII DM Depression, migraines, Hx kidney stones, HTN, OA, insomnia, PONV, restless leg syndrom Sleep apnea uses Cpap back surgery x2. Noted to have confusion, possible aphasia  after surgery. MRI brain negative. Per husband, h/o "brain fog" after previous surgeries taking a few weeks to clear. Per husband, this has been worse this time.   Assessment / Plan / Recommendation Clinical Impression  Patient presents with deficits in the areas of sustained attention, word finding, reasoning and problem solving, improved from previous date based on other  therapy notes. She will however benefit from continued SLP services to maximize potential for cognitive recovery as she was previously independent prior to admission. Recommend CIR consult.    SLP Assessment  SLP Recommendation/Assessment: Patient needs continued Speech Lanaguage Pathology Services SLP Visit Diagnosis: Cognitive communication deficit (R41.841)    Follow Up Recommendations  Inpatient Rehab    Frequency and Duration min 2x/week  2 weeks      SLP Evaluation Cognition  Overall Cognitive Status: Impaired/Different from baseline Arousal/Alertness: Awake/alert Orientation Level: Oriented to person;Oriented to place;Oriented to situation Attention: Sustained Sustained Attention: Impaired Sustained Attention Impairment: Functional complex;Verbal complex Memory: Impaired Memory Impairment: Storage deficit;Retrieval deficit Awareness: Appears intact Problem Solving: Impaired Problem Solving Impairment: Verbal complex;Functional complex Executive Function: Reasoning Reasoning: Impaired Reasoning Impairment: Verbal complex Safety/Judgment: Appears intact       Comprehension  Auditory Comprehension Overall Auditory Comprehension: Appears within functional limits for tasks assessed Visual Recognition/Discrimination Discrimination: Within Function Limits Reading Comprehension Reading Status: Not tested    Expression Verbal Expression Overall Verbal Expression: Impaired Initiation: No impairment Level of Generative/Spontaneous Verbalization: Conversation Repetition: No impairment Naming: Impairment Responsive: 76-100% accurate Confrontation: Impaired Convergent: 75-100% accurate   Oral / Motor  Oral Motor/Sensory Function Overall Oral Motor/Sensory Function: Within functional limits Motor Speech Overall Motor Speech: Appears within functional limits for tasks assessed   GO                   Ferdinand Lango MA, CCC-SLP  Breelyn Icard Meryl 05/07/2021, 11:14 AM

## 2021-05-07 NOTE — Consult Note (Addendum)
NAME:  Tammy Hicks, MRN:  [id number], DOB:  1945/12/05, LOS: 2 ADMISSION DATE:  05/05/2021, CONSULTATION DATE:  05/06/2021 REFERRING MD:  Dr. Saintclair Halsted, CHIEF COMPLAINT:  Hypotension/Confusion  History of Present Illness:  Ms. Tammy Hicks is a 75 y.o. female with PMH of HTN, hyperlipidemia, diabetes, chronic lower back pain, prior spinal surgery (2006, 2011, 2018). She was consulted to PCCM d/t confusion, and sudden hypotension and hypoxemia s/p ALIF of her L5-S1 (7/18).  Patient underwent 10 hr procedure for anterior lumbar interbody fusion of L5-S1 with an EBL of 585m. Per husband, patient has a history of waxing and waning confusion immediately following her prior surgeries that takes a couple days to fully resolve, mostly related to memory and orientation such as forgetting what an individual said or forgetting why she was in the hospital. However, those procedures were not nearly as long as her most recent surgery. He states it has never been as bad as today; for example this morning she was unable to identify what a cup is used for and had trouble interpreting a calendar, but cognitive status improved later in the day.  Patient was working with OT and was sitting when she was noted to be hypotensive and hypoxic. She remained hypotensive when OT had her lie back down in bed (BP to 70/43 and SpO2 80%). Oxygen levels shortly improved back to normal. Patient denied any dizziness during episode.  Pertinent  Medical History  -HTN -Hyperlipidemia -Diabetes -Back surgery x3 (2006, 2011, and 2018 with Dr. CSaintclair Halsted  [facility]cluding procedures, antibiotic start and stop dates in addition to other pertinent events   7/18- anterior lumbar interbody fusion (EBL: 5056m 7/19- MRI brain negative for infarct or other acute finding. CTA negative for PE. Transfused 1 U PRBC (hgb 7.5). EEG semiology suggestive of moderate-diffuse toxic-metabolic encephalopathy and negative for  seizures.   Interim History / Subjective:  Patient continues to have some word finding difficulty this morning. Able to describe what she is doing, and able to verbalize her drink and most of the food on her plate. Unable to verbalize "toast" that was on her plate, but affirmed that it was "french toast" when I listed alternative choices. Nurse states her cognition is significantly improved compared to yesterday morning.  Objective   Blood pressure 127/70, pulse 100, temperature 99.3 F (37.4 C), temperature source Oral, resp. rate 18, height _0  (1.6 m), weight 73 kg, SpO2 100 %.        Intake/Output Summary (Last 24 hours) at 05/07/2021 1047 Last data filed at 05/07/2021 [id number] per 24 hour  Intake 2115.15 ml  Output 2170 ml  Net -54.85 ml    Filed Weights   05/05/21 0544  Weight: 73 kg    Examination: General: Patient appears comfortable, eating breakfast in bed. HENT: Normocephalic and atraumatic. Pupils equal, round and reactive to light. Lungs: Clear breath sounds bilaterally. Normal work of breathing. Cardiovascular: normal sinus rhythm Abdomen: Nondistended Extremities: No edema. Neuro: Awake, alert and oriented to self, location, and reason for being in the hospital. Not oriented to date, month, or year. Able to list months of the year forward without assist. Able to list a couple of the months of the year backwards. CN 2-12 intact. Motor strength 5/5 in upper extremities bilaterally and 4+/5 in lower extremities. Bicep and brachioradialis reflex 2+ bilaterally. Patellar reflex 1+.  CXR: IMPRESSION: No acute disease.  CTA: IMPRESSION: No definite evidence of pulmonary embolus. No significant abnormality seen  in the chest.  MRI Brain: IMPRESSION: 1. Significantly motion degraded study. 2. Negative for infarct or other acute finding.  EEG: IMPRESSION: This study showed generalized periodic discharges with triphasic morphology at 1 Hz which can on the  ictal-interictal continuum with low potential for seizures.  Although, this semiology and morphology is more commonly seen in toxic-metabolic causes. Additionally there is moderate diffuse encephalopathy, nonspecific etiology.  No seizures were seen throughout the recording.  Resolved Hospital Problem list     Assessment & Plan:  Confusion: Waxing and waning picture in setting of prolonged surgical anesthesia and history of post-operative delirium following prior surgeries makes POD towards the top of the differential diagnosis. Patient has also had low blood pressure following her procedure, and given her history of hypertension and hyperlipidemia, I would also consider a brain perfusion related etiology. MRI brain did not show evidence of an infarct, although an ischemic stroke may not appear on MRI acutely. Patient demonstrates marked improvement (7/20) compared to prior day (7/19).  -Continue monitoring, reorientation, limit sedating medications. -Will sign off at this time  Hypotension: Patient has hx of HTN. Her BP since surgery 7/18 has been between 841-660 systolic. She started irbesartan (Avapro) and hydrochlorothiazide on 7/19 AM after which her BP dropped to the 63'K systolic. Suspect this was due to her medications in the setting of possible low volume status. Will hold her irbesartan and hydrochlorothiazide for now. -d/c irbesartan (Avapro) 300 mg -d/c hydrochlorothiazide (Hydrodiuril) 25 mg  Hypoxemia: Patient's SpO2 dropped while patient worked with OT. Patient's SpO2 also dropped briefly to the 70's during my physical exam and had irregular waveforms on the monitor before returning to to high 90's with normal waveform. Suspect this was due to machine error and not a true reading of hypoxemia.  Normocytic Anemia: Hemoglobin at 7.5 (7/19) following surgery. -transfused 1 U PRBC 7/19.   Best Practice (right click and "Reselect all SmartList Selections" daily)   Diet/type: Regular  consistency (see orders) DVT prophylaxis: SCD GI prophylaxis: PPIProtonix 40 mg qd Lines: Central line Foley:  Yes, and it is still needed Code Status:  full code Last date of multidisciplinary goals of care discussion [NA]  Labs   CBC: Recent Labs  Lab 05/01/21 1122 05/05/21 1017 05/05/21 1340 05/05/21 1345 05/05/21 1830 05/06/21 1230 05/06/21 2031  WBC 9.0  --   --   --  17.1* 9.2 11.1*  NEUTROABS  --   --   --   --   --  7.2  --   HGB 11.8*   < > 9.9* 10.5* 9.4* 7.5* 8.8*  HCT 36.4   < > 29.0* 31.0* 28.0* 22.9* 26.2*  MCV 91.7  --   --   --  91.2 93.1 91.3  PLT 354  --   --   --  252 209 199   < > = values in this interval not displayed.     Basic Metabolic Panel: Recent Labs  Lab 05/01/21 1122 05/05/21 1017 05/05/21 1021 05/05/21 1340 05/05/21 1345 05/05/21 1830 05/06/21 1230  NA 136 139 138 138 138 136 138  K 3.8 3.7 3.7 3.7 3.7 4.2 3.8  CL 104 106  --  106  --  107 107  CO2 25  --   --   --   --  20* 23  GLUCOSE 81 127*  --  174*  --  179* 149*  BUN 21 20  --  19  --  18 20  CREATININE 1.03*  0.80  --  0.80  --  0.90 1.25*  CALCIUM 9.6  --   --   --   --  8.9 8.5*    GFR: Estimated Creatinine Clearance: 37.8 mL/min (A) (by C-G formula based on SCr of 1.25 mg/dL (H)). Recent Labs  Lab 05/01/21 1122 05/05/21 1830 05/06/21 1230 05/06/21 2031  WBC 9.0 17.1* 9.2 11.1*     Liver Function Tests: Recent Labs  Lab 05/06/21 1230  AST 23  ALT 10  ALKPHOS 32*  BILITOT 0.8  PROT 5.2*  ALBUMIN 3.2*    No results for input(s): LIPASE, AMYLASE in the last 168 hours. No results for input(s): AMMONIA in the last 168 hours.  ABG    Component Value Date/Time   PHART 7.322 (L) 05/05/2021 1345   PCO2ART 42.1 05/05/2021 1345   PO2ART 148 (H) 05/05/2021 1345   HCO3 21.7 05/05/2021 1345   TCO2 23 05/05/2021 1345   ACIDBASEDEF 4.0 (H) 05/05/2021 1345   O2SAT 99.0 05/05/2021 1345      Coagulation Profile: No results for input(s): INR, PROTIME in the  last 168 hours.  Cardiac Enzymes: No results for input(s): CKTOTAL, CKMB, CKMBINDEX, TROPONINI in the last 168 hours.  HbA1C: Hgb A1c MFr Bld  Date/Time Value Ref Range Status  05/01/2021 11:22 AM 6.0 (H) 4.8 - 5.6 % Final    Comment:    (NOTE) Pre diabetes:          5.7%-6.4%  Diabetes:              >6.4%  Glycemic control for   <7.0% adults with diabetes   06/14/2017 09:10 AM 6.3 (H) 4.8 - 5.6 % Final    Comment:    (NOTE) Pre diabetes:          5.7%-6.4% Diabetes:              >6.4% Glycemic control for   <7.0% adults with diabetes     CBG: Recent Labs  Lab 05/06/21 0732 05/06/21 1148 05/06/21 1534 05/06/21 2213 05/07/21 0736  GLUCAP 91 156* 86 138* 109*     Review of Systems:   See above in HPI  Past Medical History:  She,  has a past medical history of Anxiety, Chronic kidney disease, Depression, Diabetes mellitus without complication ([place]), Family history of adverse reaction to anesthesia, GERD (gastroesophageal reflux disease), Headache, History of kidney stones, Hyperlipidemia, Hypertension, Insomnia, Osteoarthritis, PONV (postoperative nausea and vomiting), Restless leg syndrome, and Sleep apnea.   Surgical History:   Past Surgical History:  Procedure Laterality Date   ABDOMINAL HYSTERECTOMY     BACK SURGERY     x2    BREAST SURGERY     Fibroid tumors removed   CESAREAN SECTION     COLONOSCOPY     x2   LITHOTRIPSY     "several"   TONSILLECTOMY       Social History:   reports that she has never smoked. She has never used smokeless tobacco. She reports that she does not drink alcohol and does not use drugs.   Family History:  Her family history is not on file.   Allergies Allergies  Allergen Reactions   Pioglitazone Swelling and Other (See Comments)    Leg swelling Fatigue AKA: ACTOS  Leg swelling   Primidone Palpitations, Shortness Of Breath and Other (See Comments)    DYSPNEA   Flexeril [Cyclobenzaprine] Other (See Comments)     Confusion   Codeine Nausea Only and Nausea And Vomiting  Severe nausea      Home Medications  Prior to Admission medications   Medication Sig Start Date End Date Taking? Authorizing Provider  acetaminophen (TYLENOL) 500 MG tablet Take 1,000-1,500 mg by mouth every 4 (four) hours as needed for moderate pain.   Yes [provider]  azelastine (ASTELIN) 0.1 % nasal spray Place 2 sprays into both nostrils 2 (two) times daily as needed for rhinitis or allergies. 09/30/17  Yes [provider]  desvenlafaxine (PRISTIQ) 50 MG 24 hr tablet Take 50 mg by mouth daily. 05/07/17  Yes [provider]  gabapentin (NEURONTIN) 100 MG capsule Take 100 mg by mouth 3 (three) times daily. 04/30/17  Yes [provider]  glipiZIDE (GLUCOTROL XL) 5 MG 24 hr tablet Take 5 mg by mouth 2 (two) times daily. 03/24/21  Yes [provider]  hydrOXYzine (ATARAX/VISTARIL) 10 MG tablet Take 10 mg by mouth every 8 (eight) hours as needed for anxiety.   Yes [provider]  insulin regular (NOVOLIN R) 100 units/mL injection Inject 2-8 Units into the skin 3 (three) times daily as needed for high blood sugar. 03/09/17  Yes [provider]  metFORMIN (GLUCOPHAGE) 500 MG tablet Take 500 mg by mouth 2 (two) times daily with a meal.   Yes [provider]  olmesartan-hydrochlorothiazide (BENICAR HCT) 40-25 MG tablet Take 1 tablet by mouth daily.   Yes [provider]  omeprazole (PRILOSEC) 20 MG capsule Take 20 mg by mouth daily. 12/08/16  Yes [provider]  oxyCODONE-acetaminophen (PERCOCET) 10-325 MG tablet Take 1 tablet by mouth every 4 (four) hours as needed for pain.   Yes [provider]  potassium chloride SA (KLOR-CON) 20 MEQ tablet Take 20 mEq by mouth 2 (two) times daily. 01/23/21  Yes [provider]  pramipexole (MIRAPEX) 1 MG tablet Take 1 mg by mouth at bedtime.   Yes [provider]  pravastatin (PRAVACHOL) 10  MG tablet Take 10 mg by mouth daily.   Yes [provider]  sitaGLIPtin (JANUVIA) 100 MG tablet Take 100 mg by mouth daily. 06/07/17  Yes [provider]  traMADol (ULTRAM) 50 MG tablet Take 50-100 mg by mouth every 6 (six) hours as needed for severe pain. 02/12/21  Yes [provider]  Vitamin D, Ergocalciferol, (DRISDOL) 1.25 MG (50000 UNIT) CAPS capsule Take 50,000 Units by mouth once a week. 03/29/21  Yes [provider]  ACETAMINOPHEN-BUTALBITAL 50-325 MG TABS Take 1 tablet by mouth every 4 (four) hours as needed (headache).    [provider]  aspirin 81 MG EC tablet Take 81 mg by mouth daily.    [provider]  Blood Glucose Monitoring Suppl (ONE TOUCH ULTRA 2) w/Device KIT Use as instructed. Check blood sugar 2 times per day. Dx. E11.9 01/05/19   [provider]  Estradiol 10 MCG TABS vaginal tablet Place 1 tablet vaginally 2 (two) times a week. 01/12/21   [provider]  fluticasone (FLONASE) 50 MCG/ACT nasal spray Place 1 spray into both nostrils daily as needed for allergies. 10/16/15   [provider]  furosemide (LASIX) 40 MG tablet Take 40 mg by mouth daily as needed for fluid.    [provider]  Insulin Syringe-Needle U-100 (B-D INS SYR ULTRAFINE 1CC/30G) 30G X 1/2" 1 ML MISC See admin instructions. 10/17/19   [provider]  Insulin Syringe-Needle U-100 25G X 1" 1 ML MISC Use 3 times a day as needed 03/09/17   [provider]  meclizine (ANTIVERT) 25 MG tablet Take 25 mg by mouth every 6 (six) hours as needed for dizziness.    [provider]  OneTouch Delica Lancets 62B MISC 1 each by Other route 2 times daily. Dx E11.9 02/22/15   [provider]  zolpidem (AMBIEN CR) 12.5 MG CR tablet Take 12.5 mg by mouth at bedtime as needed for sleep.    [provider]    Kemper Durie, M.S. [facility]   PCCM will sign off. Thank  you for the opportunity to participate in this patient's care. Please contact if we can be of further assistance.    Critical care time: 35 minutes     ---------------------------------  Attending note: I have seen and examined the patient. History, labs and imaging reviewed.  75 year old with history of hypertension, hyperlipidemia, diabetes admitted for anterior lumbar fusion of J6-E8, complicated by postop delirium, confusion with negative CT and MRI.  PCCM called for sudden onset of hypotension and hypoxia while working with PT/OT.  he is much more stable today, mental status improved S/p 1 unit PRBC  Blood pressure (!) 89/70, pulse (!) 103, temperature 99 F (37.2 C), temperature source Oral, resp. rate 13, height _0  (1.6 m), weight 73 kg, SpO2 99 %. Gen:      No acute distress, chronically ill-appearing HEENT:  EOMI, sclera anicteric Neck:     No masses; no thyromegaly Lungs:    Clear to auscultation bilaterally; normal respiratory effort CV:         Regular rate and rhythm; no murmurs Abd:      + bowel sounds; soft, non-tender; no palpable masses, no distension Ext:    No edema; adequate peripheral perfusion Skin:      Warm and dry; no rash Neuro: alert and oriented x 3 Psych: normal mood and affect   Labs/Imaging personally reviewed, significant for CT reviewed with no pulmonary embolism and normal lungs.  Hemoglobin 8.8  Assessment/plan: Postop delirium > improving Normal CT, MRI Continue monitoring, reorientation, limit sedating medications   Hypotension, hypoxia > improved May be volume depleted versus vasovagal, low hemoglobin noted from blood loss No evidence of PE, hemoglobin is stable status posttransfusion  Continue care per primary team PCCM will sign off.  Please call with any questions.  Marshell Garfinkel MD [facility] See Amion for pager  If no response to pager , please call [phone] until 7pm After 7:00 pm call Elink   [phone] 05/07/2021, 10:47 AM

## 2021-05-07 NOTE — Progress Notes (Signed)
Subjective: Patient reports  much better less confusion worked well with physical therapy back pain but improved leg pain  Objective: Vital signs in last 24 hours: Temp:  [98 F (36.7 C)-99.6 F (37.6 C)] 99.3 F (37.4 C) (07/20 0735) Pulse Rate:  [95-117] 100 (07/20 0820) Resp:  [13-36] 18 (07/20 0820) BP: (70-127)/(39-87) 127/70 (07/20 0820) SpO2:  [80 %-100 %] 100 % (07/20 0820)  Intake/Output from previous day: 07/19 0701 - 07/20 0700 In: 2315.2 [P.O.:570; I.V.:84.7; Blood:435.4; IV Piggyback:1225] Out: 2170 [Urine:1850; Drains:320] Intake/Output this shift: No intake/output data recorded.  Awake alert strength 5 out of 5 incision clean dry and intact  Lab Results: Recent Labs    05/06/21 1230 05/06/21 2031  WBC 9.2 11.1*  HGB 7.5* 8.8*  HCT 22.9* 26.2*  PLT 209 199   BMET Recent Labs    05/05/21 1830 05/06/21 1230  NA 136 138  K 4.2 3.8  CL 107 107  CO2 20* 23  GLUCOSE 179* 149*  BUN 18 20  CREATININE 0.90 1.25*  CALCIUM 8.9 8.5*    Studies/Results: DG Lumbar Spine 2-3 Views  Result Date: 05/05/2021 CLINICAL DATA:  Surgical fusion of L1-2. EXAM: LUMBAR SPINE - 2-3 VIEW COMPARISON:  None. FINDINGS: Three intraoperative fluoroscopic images were obtained of the upper lumbar spine. These images demonstrate the patient be status post surgical posterior fusion of L1-2 with bilateral intrapedicular screw placement interbody fusion. IMPRESSION: Fluoroscopic guidance provided during surgical fusion of L1-2. Electronically Signed   By: Lupita Raider M.D.   On: 05/05/2021 13:07   CT HEAD WO CONTRAST  Result Date: 05/05/2021 CLINICAL DATA:  Mental status change, unknown cause. Additional history provided: Mental status change status post lumbar surgery today. EXAM: CT HEAD WITHOUT CONTRAST TECHNIQUE: Contiguous axial images were obtained from the base of the skull through the vertex without intravenous contrast. COMPARISON:  No pertinent prior exams available for  comparison. FINDINGS: Brain: Mild generalized cerebral atrophy. There is no acute intracranial hemorrhage. No demarcated cortical infarct. No extra-axial fluid collection. No evidence of an intracranial mass. No midline shift. Partially empty sella turcica. Vascular: No hyperdense vessel. Skull: No calvarial fracture or focal suspicious osseous lesion. Sinuses/Orbits: Visualized orbits show no acute finding. Trace mucosal thickening within the bilateral ethmoid and left maxillary sinuses at the imaged levels. Small fluid levels within the bilateral sphenoid sinuses. IMPRESSION: No evidence of acute intracranial abnormality. Mild generalized cerebral atrophy. Paranasal sinus disease, as described. Electronically Signed   By: Jackey Loge DO   On: 05/05/2021 20:28   CT Angio Chest Pulmonary Embolism (PE) W or WO Contrast  Result Date: 05/06/2021 CLINICAL DATA:  High probability for pulmonary embolus. EXAM: CT ANGIOGRAPHY CHEST WITH CONTRAST TECHNIQUE: Multidetector CT imaging of the chest was performed using the standard protocol during bolus administration of intravenous contrast. Multiplanar CT image reconstructions and MIPs were obtained to evaluate the vascular anatomy. CONTRAST:  21mL OMNIPAQUE IOHEXOL 350 MG/ML SOLN COMPARISON:  None. FINDINGS: Cardiovascular: Satisfactory opacification of the pulmonary arteries to the segmental level. No evidence of pulmonary embolism. Normal heart size. No pericardial effusion. Mediastinum/Nodes: No enlarged mediastinal, hilar, or axillary lymph nodes. Thyroid gland, trachea, and esophagus demonstrate no significant findings. Lungs/Pleura: No pneumothorax or pleural effusion is noted. Minimal bilateral posterior basilar subsegmental atelectasis is noted. Upper Abdomen: No acute abnormality. Musculoskeletal: No chest wall abnormality. No acute or significant osseous findings. Review of the MIP images confirms the above findings. IMPRESSION: No definite evidence of pulmonary  embolus. No significant  abnormality seen in the chest. Aortic Atherosclerosis (ICD10-I70.0). Electronically Signed   By: Lupita Raider M.D.   On: 05/06/2021 19:51   MR BRAIN WO CONTRAST  Result Date: 05/06/2021 CLINICAL DATA:  Neuro deficit with acute stroke suspected EXAM: MRI HEAD WITHOUT CONTRAST TECHNIQUE: Multiplanar, multiecho pulse sequences of the brain and surrounding structures were obtained without intravenous contrast. COMPARISON:  Yesterday FINDINGS: Brain: No acute infarction, hemorrhage, hydrocephalus, extra-axial collection or mass lesion. Vascular: Normal flow voids. Skull and upper cervical spine: Normal marrow signal. Sinuses/Orbits: Negative. Other: Progressively motion degraded scan with multiple nondiagnostic images. IMPRESSION: 1. Significantly motion degraded study. 2. Negative for infarct or other acute finding. Electronically Signed   By: Marnee Spring M.D.   On: 05/06/2021 11:40   DG CHEST PORT 1 VIEW  Result Date: 05/06/2021 CLINICAL DATA:  Shortness of breath. EXAM: PORTABLE CHEST 1 VIEW COMPARISON:  Single-view of the chest 05/05/2021. FINDINGS: Right IJ central venous catheter is unchanged. Lungs are clear. Heart size is normal. No pneumothorax or pleural fluid. No acute or focal bony abnormality. IMPRESSION: No acute disease. Electronically Signed   By: Drusilla Kanner M.D.   On: 05/06/2021 13:45   DG CHEST PORT 1 VIEW  Result Date: 05/05/2021 CLINICAL DATA:  Central venous catheter placement EXAM: PORTABLE CHEST 1 VIEW COMPARISON:  08/12/2010 FINDINGS: Right internal jugular central venous catheter is in place with its tip at the superior cavoatrial junction. Lungs are clear. No pneumothorax or pleural effusion. Cardiac size within normal limits. Pulmonary vascularity is normal. Surgical drainage catheter overlies the epigastrium and right upper quadrant. IMPRESSION: Central venous catheter tip within the superior cavoatrial junction. No pneumothorax. Electronically  Signed   By: Helyn Numbers MD   On: 05/05/2021 18:29   EEG adult  Result Date: 05/06/2021 Charlsie Quest, MD     05/06/2021  4:17 PM Patient Name: Tammy Hicks MRN: [id number] Epilepsy Attending: Charlsie Quest Referring Physician/Provider: Dr Elige Radon Date: 05/06/2021 Duration: 23.48 mins Patient history: 75 year old female with new speech disturbance.  EEG to evaluate for seizures. Level of alertness: awake AEDs during EEG study: GBP Technical aspects: This EEG study was done with scalp electrodes positioned according to the 10-20 International system of electrode placement. Electrical activity was acquired at a sampling rate of 500Hz  and reviewed with a high frequency filter of 70Hz  and a low frequency filter of 1Hz . EEG data were recorded continuously and digitally stored. Description: No clear posterior dominant rhythm was seen. EEG showed continuous generalized slowing. Intermittent Generalized periodic discharges with triphasic morphology at 1Hz  were also noted.  Hyperventilation and photic stimulation were not performed.   ABNORMALITY - Periodic discharges with triphasic morphology, generalized ( GPDs) - Continuous slow, generalized IMPRESSION: This study showed generalized periodic discharges with triphasic morphology at 1 Hz which can on the ictal-interictal continuum with low potential for seizures.  Although, this semiology and morphology is more commonly seen in toxic-metabolic causes. Additionally there is moderate diffuse encephalopathy, nonspecific etiology.  No seizures were seen throughout the recording.   DG C-Arm [phone] Min  Result Date: 05/05/2021 CLINICAL DATA:  Lumbar fusion. EXAM: DG C-ARM 1-60 MIN CONTRAST:  None. FLUOROSCOPY TIME:  Number of Acquired Spot Images: 3. COMPARISON:  None. FINDINGS: Status post surgical posterior fusion of L2-3 with interbody fusion. Postsurgical changes are also seen involving L4-5 and L5-S1. Surgical clips are seen projected  over the sacrum. No other radiopaque foreign body is noted. IMPRESSION: Postsurgical changes as described  above. No other radiopaque foreign body seen. These results were called by telephone at the time of interpretation on 05/05/2021 at 12:23 pm to Jess in OR 21, who verbally acknowledged these results. Electronically Signed   By: Lupita Raider M.D.   On: 05/05/2021 12:24    Assessment/Plan: Postop day 2 anterior posterior lumbar fusion doing very well with significant provement preoperative leg pain significant provement confusion overnight posttransfusion blood pressure and heart rate are much better continue to mobilize with physical patient therapy I do not think the patient will need CIR support be okay with home health after another day or 2 of therapy we will see how it goes  LOS: 2 days     Mariam Dollar 05/07/2021, 11:07 AM

## 2021-05-07 NOTE — Plan of Care (Signed)

## 2021-05-07 NOTE — Progress Notes (Signed)
Physical Therapy Treatment Patient Details Name: Tammy Hicks MRN: [id number] DOB: [birth year] Today's Date: 05/07/2021    History of Present Illness 75 yo female ALIF L5-S1 anterior retroperitoneal approach, XLIF L1-2, L5-S1 lumbar laminectomy, microdiscectomy, and posterior screws with pelvic fixation with iliac screws. PMH anxiety, CKDIII DM Depression, migraines, Hx kidney stones, HTN, OA, insomnia, PONV, restless leg syndrom Sleep apnea uses Cpap back surgery x2,    PT Comments    Pt progressing well towards her physical therapy goals, demonstrating improved activity tolerance and ambulation distance today. Orthostatics negative upon assessment. Pt requiring moderate assist for bed mobility, min assist for transfers/ambulation. Ambulating x 60 feet with a walker, requiring intermittent assist for steering walker. Pt oriented to self and place only; follows 1 step commands with increased time and demonstrates decreased problem solving abilities. Continue to recommend comprehensive inpatient rehab (CIR) for post-acute therapy needs.   Follow Up Recommendations  CIR     Equipment Recommendations  None recommended by PT (Has needed DME)    Recommendations for Other Services       Precautions / Restrictions Precautions Precautions: Back Precaution Booklet Issued: Yes (comment) Required Braces or Orthoses: Spinal Brace Spinal Brace: Lumbar corset;Applied in sitting position Restrictions Weight Bearing Restrictions: No    Mobility  Bed Mobility Overal bed mobility: Needs Assistance Bed Mobility: Sidelying to Sit;Rolling Rolling: Min assist Sidelying to sit: Mod assist       General bed mobility comments: MinA to roll towards left, assist for BLE's off edge of bed and trunk to upright    Transfers Overall transfer level: Needs assistance Equipment used: Rolling walker (2 wheeled) Transfers: Sit to/from Stand Sit to Stand: Min assist         General transfer comment:  MinA to rise from edge of bed and recliner  Ambulation/Gait Ambulation/Gait assistance: Min assist Gait Distance (Feet): 60 Feet Assistive device: Rolling walker (2 wheeled) Gait Pattern/deviations: Step-through pattern;Decreased stride length Gait velocity: decreased Gait velocity interpretation: <1.8 ft/sec, indicate of risk for recurrent falls General Gait Details: MinA for balance, cues for sequencing/direction, manual assist for negotiating walker, particularly with turning or with obstacles   Stairs             Wheelchair Mobility    Modified Rankin (Stroke Patients Only)       Balance Overall balance assessment: Needs assistance Sitting-balance support: Bilateral upper extremity supported;Feet supported Sitting balance-Leahy Scale: Fair     Standing balance support: Bilateral upper extremity supported Standing balance-Leahy Scale: Poor Standing balance comment: reliant on external support                            Cognition Arousal/Alertness: Awake/alert Behavior During Therapy: Flat affect Overall Cognitive Status: Impaired/Different from baseline Area of Impairment: Orientation;Attention;Memory;Following commands;Safety/judgement;Awareness;Problem solving                 Orientation Level: Disoriented to;Time;Situation Current Attention Level: Sustained Memory: Decreased recall of precautions;Decreased short-term memory Following Commands: Follows one step commands inconsistently;Follows one step commands with increased time Safety/Judgement: Decreased awareness of safety;Decreased awareness of deficits Awareness: Emergent Problem Solving: Slow processing;Decreased initiation;Difficulty sequencing General Comments: Oriented to self, place. Increased time for command following and problem solving      Exercises General Exercises - Lower Extremity Ankle Circles/Pumps: Both;10 reps;Seated Heel Slides: Both;10 reps;Supine    General  Comments  Orthostatics: supine - 127/70, sitting 123/64, standing 124/65. 97% on RA, HR 102  Pertinent Vitals/Pain Pain Assessment: Faces Faces Pain Scale: Hurts even more Pain Location: surgical site Pain Descriptors / Indicators: Discomfort;Grimacing;Guarding Pain Intervention(s): Monitored during session;Limited activity within patient's tolerance;Premedicated before session    Home Living                      Prior Function            PT Goals (current goals can now be found in the care plan section) Acute Rehab PT Goals Patient Stated Goal: none stated at this time PT Goal Formulation: With patient Time For Goal Achievement: 05/20/21 Potential to Achieve Goals: Good Progress towards PT goals: Progressing toward goals    Frequency    Min 5X/week      PT Plan Current plan remains appropriate    Co-evaluation              AM-PAC PT "6 Clicks" Mobility   Outcome Measure  Help needed turning from your back to your side while in a flat bed without using bedrails?: A Little Help needed moving from lying on your back to sitting on the side of a flat bed without using bedrails?: A Lot Help needed moving to and from a bed to a chair (including a wheelchair)?: A Little Help needed standing up from a chair using your arms (e.g., wheelchair or bedside chair)?: A Little Help needed to walk in hospital room?: A Little Help needed climbing 3-5 steps with a railing? : A Lot 6 Click Score: 16    End of Session Equipment Utilized During Treatment: Gait belt;Back brace Activity Tolerance: Patient tolerated treatment well Patient left: in chair;with call bell/phone within reach;with chair alarm set Nurse Communication: Mobility status PT Visit Diagnosis: Unsteadiness on feet (R26.81);Difficulty in walking, not elsewhere classified (R26.2);Pain Pain - part of body:  (back)     Time: 4580-9983 PT Time Calculation (min) (ACUTE ONLY): 33 min  Charges:  $Gait  Training: 8-22 mins $Therapeutic Activity: 8-22 mins                     Lillia Pauls, PT, DPT Acute Rehabilitation Services Pager [phone] Office [phone]    Norval Morton 05/07/2021, 9:18 AM

## 2021-05-07 NOTE — Progress Notes (Addendum)
STROKE TEAM PROGRESS NOTE   INTERVAL HISTORY Husband present at bedside this morning. Discussed likely cause of mental status changes yesterday. All questions were answered.  Patient seems to have been doing a lot better today but speech is much more fluent and clear and not hesitant though she continues to have some disorientation and attention problems Vitals:   05/07/21 1138 05/07/21 1200 05/07/21 1409 05/07/21 1509  BP: (!) 109/59     Pulse: 100 96 99 98  Resp: (!) 26 20 20 19   Temp: 98.8 F (37.1 C)     TempSrc: Oral     SpO2: 94% 90% 94% 95%  Weight:      Height:       CBC:  Recent Labs  Lab 05/06/21 1230 05/06/21 2031  WBC 9.2 11.1*  NEUTROABS 7.2  --   HGB 7.5* 8.8*  HCT 22.9* 26.2*  MCV 93.1 91.3  PLT 209 199   Basic Metabolic Panel:  Recent Labs  Lab 05/05/21 1830 05/06/21 1230  NA 136 138  K 4.2 3.8  CL 107 107  CO2 20* 23  GLUCOSE 179* 149*  BUN 18 20  CREATININE 0.90 1.25*  CALCIUM 8.9 8.5*   Lipid Panel: No results for input(s): CHOL, TRIG, HDL, CHOLHDL, VLDL, LDLCALC in the last 168 hours. HgbA1c:  Recent Labs  Lab 05/01/21 1122  HGBA1C 6.0*   Urine Drug Screen: No results for input(s): LABOPIA, COCAINSCRNUR, LABBENZ, AMPHETMU, THCU, LABBARB in the last 168 hours.  Alcohol Level No results for input(s): ETH in the last 168 hours.  IMAGING past 24 hours CT Angio Chest Pulmonary Embolism (PE) W or WO Contrast  Result Date: 05/06/2021 CLINICAL DATA:  High probability for pulmonary embolus. EXAM: CT ANGIOGRAPHY CHEST WITH CONTRAST TECHNIQUE: Multidetector CT imaging of the chest was performed using the standard protocol during bolus administration of intravenous contrast. Multiplanar CT image reconstructions and MIPs were obtained to evaluate the vascular anatomy. CONTRAST:  28mL OMNIPAQUE IOHEXOL 350 MG/ML SOLN COMPARISON:  None. FINDINGS: Cardiovascular: Satisfactory opacification of the pulmonary arteries to the segmental level. No evidence of  pulmonary embolism. Normal heart size. No pericardial effusion. Mediastinum/Nodes: No enlarged mediastinal, hilar, or axillary lymph nodes. Thyroid gland, trachea, and esophagus demonstrate no significant findings. Lungs/Pleura: No pneumothorax or pleural effusion is noted. Minimal bilateral posterior basilar subsegmental atelectasis is noted. Upper Abdomen: No acute abnormality. Musculoskeletal: No chest wall abnormality. No acute or significant osseous findings. Review of the MIP images confirms the above findings. IMPRESSION: No definite evidence of pulmonary embolus. No significant abnormality seen in the chest. Aortic Atherosclerosis (ICD10-I70.0). Electronically Signed   By: 45m M.D.   On: 05/06/2021 19:51    PHYSICAL EXAM General: chronically ill appearing in NAD, sitting up in the chair Neurologic exam Mental status: alert. Able to perform instant recall 2/3 words. Recall after 5 minutes 1/3 words. Difficulty with abstract thinking. Able to name only 4 animals with four legs in 60 seconds. Correctly drew a clock face however unable to correctly place the clock hands.  Clock drawing score 3/4.  No cranial nerve deficits.  Motor system exam symmetric strength in all 4 extremities the lower extremity movements like limited due to back pain.  No focal weakness.  Sensation intact bilaterally.  Gait not tested.  ASSESSMENT/PLAN  Tammy Hicks is an 75 y.o. Caucasian female with hypertension, hyperlipidemia, diabetes, anxiety/depression, baseline mild MCI, and lumbar stenosis who was admitted to the neurosurgery service on 05/05/21 for spinal fusion.  She was noted to develop some word finding difficulties post operatively, and neurology was consulted for further evaluation  Acute toxic, metabolic encephalopathy, cognitive impairment-now resolving Workup, including CT, MRI, EEG, B12, TSH, and RPR were negative.  Symptomatically improved on exam today although exam findings are suggestive of  cognitive impairment. Discussed the multifactorial nature of her acute presentation yesterday with her husband and recommended limiting centrally acting agents as much as possible.  Plan:  -no further workup indicated while inpatient although will benefit from ongoing monitoring of her cognitive impairment by her PCP.   Other active problems per primary time Chronic anemia Type 2 DM Hypertension  Hospital day # 2  Elige Radon, MD Internal Medicine Resident PGY-3 [facility] Pager: [phone] 05/07/2021 4:31 PM    I have personally obtained history,examined this patient, reviewed notes, independently viewed imaging studies, participated in medical decision making and plan of care.ROS completed by me personally and pertinent positives fully documented  I have made any additions or clarifications directly to the above note. Agree with note above.  Patient seems to be improving and expect ongoing improvement over the next couple of days.  Continue to minimize CNS active medications and pain medications.  Expect slow improvement over the next few days.  Long discussion with patient and husband at the bedside and answered questions.  Greater than 50% time during this 35-minute visit was spent in counseling and coordination of care about her encephalopathy and cognitive impairment and answering questions.  Discussed with Dr. Wynetta Emery and patient's husband.  Stroke team will sign off.  Kindly call for questions.  Delia Heady, MD Medical Director [facility] Pager: [phone] 05/07/2021 4:39 PM  To contact Stroke Continuity provider, please refer to [email or web address]. After hours, contact General Neurology

## 2021-05-07 NOTE — Progress Notes (Signed)
Inpatient Rehab Admissions Coordinator:   I met with Pt. And husband and discussed CIR at length. They feel that Pt.'s mentation has mostly cleared (that was their primary concern) and that they do not want to pursue CIR at this time. They would like to d/c home with home health PT/OT/SLP. Pt. Currently requires min assist for mobility and Pt.'s husband states he is able to provide that at d/c. CIR will sign off.   Clemens Catholic, [place], [place] Admissions Coordinator  [phone] ([place]) [phone] (office)

## 2021-05-08 LAB — GLUCOSE, CAPILLARY
Glucose-Capillary: 92 mg/dL (ref 70–99)
Glucose-Capillary: 80 mg/dL (ref 70–99)
Glucose-Capillary: 67 mg/dL — ABNORMAL LOW (ref 70–99)
Glucose-Capillary: 123 mg/dL — ABNORMAL HIGH (ref 70–99)

## 2021-05-08 LAB — CBC WITH DIFFERENTIAL/PLATELET
Abs Immature Granulocytes: 0.09 10*3/uL — ABNORMAL HIGH (ref 0.00–0.07)
Basophils Absolute: 0.1 10*3/uL (ref 0.0–0.1)
Basophils Relative: 0 %
Eosinophils Absolute: 0.1 10*3/uL (ref 0.0–0.5)
Eosinophils Relative: 1 %
HCT: 24.8 % — ABNORMAL LOW (ref 36.0–46.0)
Hemoglobin: 8.3 g/dL — ABNORMAL LOW (ref 12.0–15.0)
Immature Granulocytes: 1 %
Lymphocytes Relative: 9 %
Lymphs Abs: 1.2 10*3/uL (ref 0.7–4.0)
MCH: 30.3 pg (ref 26.0–34.0)
MCHC: 33.5 g/dL (ref 30.0–36.0)
MCV: 90.5 fL (ref 80.0–100.0)
Monocytes Absolute: 1 10*3/uL (ref 0.1–1.0)
Monocytes Relative: 8 %
Neutro Abs: 11.3 10*3/uL — ABNORMAL HIGH (ref 1.7–7.7)
Neutrophils Relative %: 81 %
Platelets: 218 10*3/uL (ref 150–400)
RBC: 2.74 MIL/uL — ABNORMAL LOW (ref 3.87–5.11)
RDW: 14.4 % (ref 11.5–15.5)
WBC: 13.8 10*3/uL — ABNORMAL HIGH (ref 4.0–10.5)
nRBC: 0 % (ref 0.0–0.2)

## 2021-05-08 MED ORDER — DOCUSATE SODIUM 100 MG PO CAPS
100.0000 mg | ORAL_CAPSULE | Freq: Every day | ORAL | Status: DC
  Administered 2021-05-08: 100 mg via ORAL
  Filled 2021-05-08 (×2): qty 1

## 2021-05-08 MED ORDER — SORBITOL 70 % SOLN
30.0000 mL | Freq: Every day | Status: DC | PRN
  Administered 2021-05-08: 30 mL via ORAL
  Filled 2021-05-08: qty 30

## 2021-05-08 MED FILL — Sodium Chloride IV Soln 0.9%: INTRAVENOUS | Qty: 1000 | Status: AC

## 2021-05-08 MED FILL — Heparin Sodium (Porcine) Inj 1000 Unit/ML: INTRAMUSCULAR | Qty: 30 | Status: AC

## 2021-05-08 NOTE — Progress Notes (Signed)
Subjective: Patient reports    patient feels much better today no leg pain condition of back pain  Objective: Vital signs in last 24 hours: Temp:  [98.2 F (36.8 C)-100.5 F (38.1 C)] 99 F (37.2 C) (07/21 0807) Pulse Rate:  [93-100] 93 (07/21 0807) Resp:  [19-29] 19 (07/21 0807) BP: (105-125)/(59-71) 117/69 (07/21 0807) SpO2:  [90 %-98 %] 96 % (07/21 0807)  Intake/Output from previous day: 07/20 0701 - 07/21 0700 In: 480 [P.O.:480] Out: 40 [Drains:40] Intake/Output this shift: No intake/output data recorded.  Strength out of 5 intact  Lab Results: Recent Labs    05/06/21 1230 05/06/21 2031  WBC 9.2 11.1*  HGB 7.5* 8.8*  HCT 22.9* 26.2*  PLT 209 199   BMET Recent Labs    05/05/21 1830 05/06/21 1230  NA 136 138  K 4.2 3.8  CL 107 107  CO2 20* 23  GLUCOSE 179* 149*  BUN 18 20  CREATININE 0.90 1.25*  CALCIUM 8.9 8.5*    Studies/Results: CT Angio Chest Pulmonary Embolism (PE) W or WO Contrast  Result Date: 05/06/2021 CLINICAL DATA:  High probability for pulmonary embolus. EXAM: CT ANGIOGRAPHY CHEST WITH CONTRAST TECHNIQUE: Multidetector CT imaging of the chest was performed using the standard protocol during bolus administration of intravenous contrast. Multiplanar CT image reconstructions and MIPs were obtained to evaluate the vascular anatomy. CONTRAST:  28mL OMNIPAQUE IOHEXOL 350 MG/ML SOLN COMPARISON:  None. FINDINGS: Cardiovascular: Satisfactory opacification of the pulmonary arteries to the segmental level. No evidence of pulmonary embolism. Normal heart size. No pericardial effusion. Mediastinum/Nodes: No enlarged mediastinal, hilar, or axillary lymph nodes. Thyroid gland, trachea, and esophagus demonstrate no significant findings. Lungs/Pleura: No pneumothorax or pleural effusion is noted. Minimal bilateral posterior basilar subsegmental atelectasis is noted. Upper Abdomen: No acute abnormality. Musculoskeletal: No chest wall abnormality. No acute or significant  osseous findings. Review of the MIP images confirms the above findings. IMPRESSION: No definite evidence of pulmonary embolus. No significant abnormality seen in the chest. Aortic Atherosclerosis (ICD10-I70.0). Electronically Signed   By: Lupita Raider M.D.   On: 05/06/2021 19:51   MR BRAIN WO CONTRAST  Result Date: 05/06/2021 CLINICAL DATA:  Neuro deficit with acute stroke suspected EXAM: MRI HEAD WITHOUT CONTRAST TECHNIQUE: Multiplanar, multiecho pulse sequences of the brain and surrounding structures were obtained without intravenous contrast. COMPARISON:  Yesterday FINDINGS: Brain: No acute infarction, hemorrhage, hydrocephalus, extra-axial collection or mass lesion. Vascular: Normal flow voids. Skull and upper cervical spine: Normal marrow signal. Sinuses/Orbits: Negative. Other: Progressively motion degraded scan with multiple nondiagnostic images. IMPRESSION: 1. Significantly motion degraded study. 2. Negative for infarct or other acute finding. Electronically Signed   By: Marnee Spring M.D.   On: 05/06/2021 11:40   DG CHEST PORT 1 VIEW  Result Date: 05/06/2021 CLINICAL DATA:  Shortness of breath. EXAM: PORTABLE CHEST 1 VIEW COMPARISON:  Single-view of the chest 05/05/2021. FINDINGS: Right IJ central venous catheter is unchanged. Lungs are clear. Heart size is normal. No pneumothorax or pleural fluid. No acute or focal bony abnormality. IMPRESSION: No acute disease. Electronically Signed   By: Drusilla Kanner M.D.   On: 05/06/2021 13:45   EEG adult  Result Date: 05/06/2021 Charlsie Quest, MD     05/06/2021  4:17 PM Patient Name: Tammy Hicks MRN: [id number] Epilepsy Attending: Charlsie Quest Referring Physician/Provider: Dr Elige Radon Date: 05/06/2021 Duration: 23.48 mins Patient history: 75 year old female with new speech disturbance.  EEG to evaluate for seizures. Level of alertness: awake AEDs during  EEG study: GBP Technical aspects: This EEG study was done with scalp electrodes  positioned according to the 10-20 International system of electrode placement. Electrical activity was acquired at a sampling rate of 500Hz  and reviewed with a high frequency filter of 70Hz  and a low frequency filter of 1Hz . EEG data were recorded continuously and digitally stored. Description: No clear posterior dominant rhythm was seen. EEG showed continuous generalized slowing. Intermittent Generalized periodic discharges with triphasic morphology at 1Hz  were also noted.  Hyperventilation and photic stimulation were not performed.   ABNORMALITY - Periodic discharges with triphasic morphology, generalized ( GPDs) - Continuous slow, generalized IMPRESSION: This study showed generalized periodic discharges with triphasic morphology at 1 Hz which can on the ictal-interictal continuum with low potential for seizures.  Although, this semiology and morphology is more commonly seen in toxic-metabolic causes. Additionally there is moderate diffuse encephalopathy, nonspecific etiology.  No seizures were seen throughout the recording. Priyanka    Assessment/Plan: Postop day 3 anterior posterior lumbar fusion doing much better less pelvic Blood pressure stable check a CBC this morning with physical therapy discontinue lisinopril Discontinue  LOS: 3 days     05/08/2021, 8:45 AM

## 2021-05-08 NOTE — Progress Notes (Signed)
Physical Therapy Treatment Patient Details Name: Tammy Hicks MRN: [id number] DOB: 03-28-1946 Today's Date: 05/08/2021    History of Present Illness 75 yo female ALIF L5-S1 anterior retroperitoneal approach, XLIF L1-2, L5-S1 lumbar laminectomy, microdiscectomy, and posterior screws with pelvic fixation with iliac screws. PMH anxiety, CKDIII DM Depression, migraines, Hx kidney stones, HTN, OA, insomnia, PONV, restless leg syndrom Sleep apnea uses Cpap back surgery x2,    PT Comments    The pt was seen for continued progression of OOB mobility, and presents with improvement in cognition and alertness at this time. The pt was able to complete 125 ft hallway ambulation and navigate stairs with BUE support through RW or rails to steady. The pt was able to follow all commands appropriately, and had no overt LOB at this time. The pt will continue to benefit from skilled PT to progress independence with transfers and gait, but is making great progress towards being able to return home with family assist.     Follow Up Recommendations  Home health PT;Supervision for mobility/OOB     Equipment Recommendations  None recommended by PT (well equipped)    Recommendations for Other Services       Precautions / Restrictions Precautions Precautions: Back Precaution Booklet Issued: Yes (comment) Required Braces or Orthoses: Spinal Brace Spinal Brace: Lumbar corset;Applied in sitting position Restrictions Weight Bearing Restrictions: No    Mobility  Bed Mobility Overal bed mobility: Needs Assistance Bed Mobility: Sidelying to Sit;Rolling Rolling: Supervision Sidelying to sit: Supervision       General bed mobility comments: pt able to complete with use of bed rail and HOB elevated to 28 deg but without physical assist and good log roll technique. will need to progress towards HOB flat    Transfers Overall transfer level: Needs assistance Equipment used: Rolling walker (2  wheeled) Transfers: Sit to/from Stand Sit to Stand: Min guard         General transfer comment: minG for safety, no physical assist to power up  Ambulation/Gait Ambulation/Gait assistance: Min guard Gait Distance (Feet): 125 Feet Assistive device: Rolling walker (2 wheeled) Gait Pattern/deviations: Step-through pattern;Decreased stride length;Trunk flexed Gait velocity: 0.02 m/s Gait velocity interpretation: <1.31 ft/sec, indicative of household ambulator General Gait Details: slight trunk flexion, no overt LOB or need for physical assist today. does require BUE support and intermittent cues for posture   Stairs Stairs: Yes Stairs assistance: Min assist Stair Management: One rail Right;Step to pattern;Forwards Number of Stairs: 2 General stair comments: able to step up with either LE, no significant change in pain     Balance Overall balance assessment: Needs assistance Sitting-balance support: Bilateral upper extremity supported;Feet supported Sitting balance-Leahy Scale: Fair     Standing balance support: Bilateral upper extremity supported Standing balance-Leahy Scale: Poor Standing balance comment: reliant on external support                            Cognition Arousal/Alertness: Awake/alert Behavior During Therapy: WFL for tasks assessed/performed Overall Cognitive Status: Impaired/Different from baseline Area of Impairment: Attention;Memory;Following commands;Safety/judgement;Problem solving                   Current Attention Level: Selective Memory: Decreased recall of precautions;Decreased short-term memory Following Commands: Follows one step commands consistently Safety/Judgement: Decreased awareness of safety Awareness: Anticipatory Problem Solving: Slow processing General Comments: pt able to follow commands briskly and accurately estimate needs/safety concerns. Pt accurately detailing home set up and discussing  safety with mobility  at home      Exercises      General Comments General comments (skin integrity, edema, etc.): VSS this session      Pertinent Vitals/Pain Pain Assessment: 0-10 Pain Score: 4  Pain Location: 3-4 at surgical site with rest, pt fearful of pain increase with stairs, but stated it did not actually increase like she expected Pain Descriptors / Indicators: Discomfort;Grimacing;Guarding Pain Intervention(s): Limited activity within patient's tolerance;Monitored during session;Repositioned     PT Goals (current goals can now be found in the care plan section) Acute Rehab PT Goals Patient Stated Goal: to go home and recover there PT Goal Formulation: With patient Time For Goal Achievement: 05/20/21 Potential to Achieve Goals: Good Progress towards PT goals: Progressing toward goals    Frequency    Min 5X/week      PT Plan Discharge plan needs to be updated       AM-PAC PT "6 Clicks" Mobility   Outcome Measure  Help needed turning from your back to your side while in a flat bed without using bedrails?: A Little Help needed moving from lying on your back to sitting on the side of a flat bed without using bedrails?: A Little Help needed moving to and from a bed to a chair (including a wheelchair)?: A Little Help needed standing up from a chair using your arms (e.g., wheelchair or bedside chair)?: A Little Help needed to walk in hospital room?: A Little Help needed climbing 3-5 steps with a railing? : A Little 6 Click Score: 18    End of Session Equipment Utilized During Treatment: Gait belt;Back brace Activity Tolerance: Patient tolerated treatment well Patient left: in chair;with call bell/phone within reach;with chair alarm set Nurse Communication: Mobility status PT Visit Diagnosis: Unsteadiness on feet (R26.81);Difficulty in walking, not elsewhere classified (R26.2);Pain     Time: 8466-5993 PT Time Calculation (min) (ACUTE ONLY): 26 min  Charges:  $Gait Training:  23-37 mins                     Lazarus Gowda, PT, DPT   Acute Rehabilitation Department Pager #: [phone]   Tammy Hicks 05/08/2021, 11:31 AM

## 2021-05-08 NOTE — Care Management Important Message (Signed)
Important Message  Patient Details  Name: Tammy Hicks MRN: [id number] Date of Birth: 12-Mar-1946   Medicare Important Message Given:  Yes     Dorena Bodo 05/08/2021, 3:56 PM

## 2021-05-09 LAB — GLUCOSE, CAPILLARY
Glucose-Capillary: 119 mg/dL — ABNORMAL HIGH (ref 70–99)
Glucose-Capillary: 96 mg/dL (ref 70–99)

## 2021-05-09 MED ORDER — TIZANIDINE HCL 2 MG PO TABS: 2.0000 mg | ORAL_TABLET | Freq: Three times a day (TID) | ORAL | 0 refills | Status: DC

## 2021-05-09 MED ORDER — OXYCODONE-ACETAMINOPHEN 5-325 MG PO TABS: 1.0000 | ORAL_TABLET | ORAL | 0 refills | Status: AC | PRN

## 2021-05-09 NOTE — Progress Notes (Signed)
Occupational Therapy Treatment Patient Details Name: Tammy Hicks MRN: [id number] DOB: 01/15/1946 Today's Date: 05/09/2021    History of present illness 75 yo female ALIF L5-S1 anterior retroperitoneal approach, XLIF L1-2, L5-S1 lumbar laminectomy, microdiscectomy, and posterior screws with pelvic fixation with iliac screws. PMH anxiety, CKDIII DM Depression, migraines, Hx kidney stones, HTN, OA, insomnia, PONV, restless leg syndrom Sleep apnea uses Cpap back surgery x2,   OT comments  Pt complete grooming task and bathroom transfer this session min guard (A). Pt able to doff back brace mod I. Recommendation for HHOT at time time and will have spouse (A)>    Follow Up Recommendations  Home health OT    Equipment Recommendations  3 in 1 bedside commode;Other (comment)    Recommendations for Other Services      Precautions / Restrictions Precautions Precautions: Back Required Braces or Orthoses: Spinal Brace Spinal Brace: Lumbar corset;Applied in sitting position       Mobility Bed Mobility   Bed Mobility: Sit to Supine       Sit to supine: Min guard   General bed mobility comments: educated on sequence with close guarding to make sure pt kept back precautions. pt able to complete side lying and prefers to stay on side. pt provided pillows and educated on positioning.    Transfers                      Balance                                           ADL either performed or assessed with clinical judgement   ADL Overall ADL's : Needs assistance/impaired     Grooming: Oral care;Min guard;Standing                 Lower Body Dressing Details (indicate cue type and reason): educated on dressing as pt was unable to figure 4 cross. pt will have spouse (A). pt plans to not wear underwear due to incision location and wear house dresses for comfort. Toilet Transfer: Biomedical engineer Details (indicate cue  type and reason): pt has sink next to commode at home. Toileting- Architect and Hygiene: Min guard;Sit to/from stand       Functional mobility during ADLs: Min guard;Rolling walker General ADL Comments: pt completed PT session and starting OT session at sink for grooming.     Vision       Perception     Praxis      Cognition Arousal/Alertness: Awake/alert Behavior During Therapy: WFL for tasks assessed/performed Overall Cognitive Status: Impaired/Different from baseline Area of Impairment: Memory;Safety/judgement                     Memory: Decreased recall of precautions;Decreased short-term memory     Awareness: Emergent   General Comments: pt needs cues during session for back precautions during ADL. Pt with no recall of previous OT session. Pt needs reinforcement with teach back. spouse present for all education with good return        Exercises     Shoulder Instructions       General Comments VSS    Pertinent Vitals/ Pain       Pain Assessment: 0-10 Pain Score: 4  Pain Descriptors / Indicators: Discomfort;Grimacing;Guarding Pain Intervention(s): Monitored during session;Premedicated before session;Repositioned  Home Living  Prior Functioning/Environment              Frequency  Min 2X/week        Progress Toward Goals  OT Goals(current goals can now be found in the care plan section)  Progress towards OT goals: Progressing toward goals  Acute Rehab OT Goals Patient Stated Goal: to rest for a bit OT Goal Formulation: With patient/family Time For Goal Achievement: 05/20/21 Potential to Achieve Goals: Good ADL Goals Pt Will Perform Lower Body Dressing: with min assist;with adaptive equipment;sit to/from stand Pt Will Transfer to Toilet: with min assist;bedside commode;ambulating Additional ADL Goal #1: pt will complete bed mobility mod i as precursor for  adls. Additional ADL Goal #2: pt will demonstrate don doff back brace mod I  Plan Discharge plan needs to be updated    Co-evaluation                 AM-PAC OT "6 Clicks" Daily Activity     Outcome Measure   Help from another person eating meals?: A Little Help from another person taking care of personal grooming?: A Little Help from another person toileting, which includes using toliet, bedpan, or urinal?: A Lot Help from another person bathing (including washing, rinsing, drying)?: A Lot Help from another person to put on and taking off regular upper body clothing?: A Little Help from another person to put on and taking off regular lower body clothing?: A Lot 6 Click Score: 15    End of Session Equipment Utilized During Treatment: Rolling walker;Back brace  OT Visit Diagnosis: Unsteadiness on feet (R26.81);Muscle weakness (generalized) (M62.81);Pain   Activity Tolerance Patient tolerated treatment well   Patient Left in bed;with call bell/phone within reach;with nursing/sitter in room;with SCD's reapplied   Nurse Communication Mobility status;Precautions        Time: 9528-4132 OT Time Calculation (min): 23 min  Charges: OT General Charges $OT Visit: 1 Visit OT Treatments $Self Care/Home Management : 23-37 mins   Brynn, OTR/L  Acute Rehabilitation Services Pager: [phone] Office: [phone] .    Mateo Flow 05/09/2021, 1:24 PM

## 2021-05-09 NOTE — TOC Initial Note (Signed)
Transition of Care [facility]) - Initial/Assessment Note    Patient Details  Name: Tammy Hicks MRN: [id number] Date of Birth: [birth year]  Transition [facility]) CM/SW Contact:    Kingsley Plan, RN Phone Number: 05/09/2021, 12:08 PM  Clinical Narrative:                 Spoke to patient and her husband at bedside.   Confirmed face sheet information.   Patient has DME at home already.   Kandee Keen with [facility] accepted referral for HHPT/OT  Expected Discharge Plan: Home w Home Health Services Barriers to Discharge: No Barriers Identified   Patient Goals and CMS Choice Patient states their goals for this hospitalization and ongoing recovery are:: to return to home CMS Medicare.gov Compare Post Acute Care list provided to:: Patient Choice offered to / list presented to : Patient  Expected Discharge Plan and Services Expected Discharge Plan: Home w Home Health Services   Discharge Planning Services: CM Consult Post Acute Care Choice: Home Health Living arrangements for the past 2 months: Single Family Home Expected Discharge Date: 05/09/21               DME Arranged: N/A         HH Arranged: PT, OT HH Agency: [facility] Date [facility] Agency Contacted: 05/09/21 Time HH Agency Contacted: [id number] Representative spoke with at [facility] Agency: Kandee Keen  Prior Living Arrangements/Services Living arrangements for the past 2 months: Single Family Home Lives with:: Spouse Patient language and need for interpreter reviewed:: Yes Do you feel safe going back to the place where you live?: Yes      Need for Family Participation in Patient Care: Yes (Comment) Care giver support system in place?: Yes (comment) Current home services: DME Criminal Activity/Legal Involvement Pertinent to Current Situation/Hospitalization: No - Comment as needed  Activities of Daily Living      Permission Sought/Granted   Permission granted to share information with : Yes, Verbal Permission Granted  Share  Information with NAME: Danny husband           Emotional Assessment Appearance:: Appears stated age Attitude/Demeanor/Rapport: Engaged Affect (typically observed): Accepting Orientation: : Oriented to Self, Oriented to Place, Oriented to  Time, Oriented to Situation Alcohol / Substance Use: Not Applicable Psych Involvement: No (comment)  Admission diagnosis:  Spinal stenosis of lumbar region [M48.061] Patient Active Problem List   Diagnosis Date Noted   Herniated lumbar disc without myelopathy 01/21/2021   Lumbago of lumbar region with sciatica 01/21/2021   Metabolic bone disease 12/16/2020   Vitamin D deficiency 12/16/2020   Primary insomnia 10/02/2020   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) 08/15/2020   Diabetes mellitus with stage 3 chronic kidney disease (HCC) 08/15/2020   Lumbar spondylosis 06/27/2020   Benign hypertension with chronic kidney disease, stage III (HCC) 06/19/2020   Hamstring tendinitis of right thigh 12/21/2019   Tension-type headache, not intractable 11/22/2018   Primary osteoarthritis of left knee 07/26/2018   Pes anserinus bursitis of left knee 07/19/2018   Nuclear sclerotic cataract of left eye 01/25/2018   Spinal stenosis of lumbar region 06/24/2017   Varicose veins of leg with swelling, right 05/18/2017   Edema of both lower legs due to peripheral venous insufficiency 03/20/2017   Right leg swelling 01/29/2017   Lumbar degenerative disc disease 05/29/2016   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   Sacroiliitis, not elsewhere classified (HCC) 03/10/2016   Obesity (BMI 30-39.9) 12/04/2015   Risk for falls 10/16/2015  Major depressive disorder, recurrent, mild (HCC) 09/03/2015   Lumbar facet joint pain 08/22/2015   Lumbar radiculopathy 08/05/2015   Coccygodynia 12/11/2014   Neck pain 10/30/2014   Essential hematuria 07/19/2014   Spinal stenosis of lumbar region without neurogenic claudication 07/19/2014    Esophageal reflux 05/28/2014   Controlled type 2 diabetes mellitus without complication, without long-term current use of insulin (HCC) 04/20/2014   BPPV (benign paroxysmal positional vertigo) 02/21/2014   Varicose veins of legs 02/21/2014   Generalized anxiety disorder 01/31/2014   Essential hypertension 11/09/2013   Hyperlipidemia LDL goal <100 07/31/2013   Insomnia 07/31/2013   Obstructive sleep apnea 07/31/2013   Osteoarthritis 07/31/2013   Restless legs syndrome 07/31/2013   Allergic rhinitis 04/25/2013   Migraine 04/25/2013   PCP:  Cheron Schaumann., MD Pharmacy:   [facility] [address] - [place], [place] - [address] [place] [address] Phone: [phone] Fax: [phone]  [id number] - [address] [place] - [address] AT [facility] OF [facility] & [address] [facility] [place] [address] Phone: [phone] Fax: [phone]     Social Determinants of Health (SDOH) Interventions    Readmission Risk Interventions No flowsheet data found.

## 2021-05-09 NOTE — Progress Notes (Signed)
Pt with discharge orders, discharge paperwork reviewed with patient and all questions answered. IV's removed, information provided on where to pickup meds . Pt given (back brace) all equipment and taken down via wheelchair with all belongings.

## 2021-05-09 NOTE — Discharge Summary (Signed)
Physician Discharge Summary  Patient ID: Tammy Hicks MRN: [id number] DOB/AGE: 1946/05/01 75 y.o.  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    Discharge Diagnoses: same   Discharged Condition: good  Hospital Course: The patient was admitted on 05/05/2021 and taken to the operating room where the patient underwent XLIF L1-2, ALIF L5-S1, posterior hardware placement L1-2, L5-ileum. The patient tolerated the procedure well and was taken to the recovery room and then to the floor in stable condition. The hospital course was routine. There were no complications. The wound remained clean dry and intact. Pt had appropriate back soreness. No complaints of leg pain or new N/T/W. The patient remained afebrile with stable vital signs, and tolerated a regular diet. The patient continued to increase activities, and pain was well controlled with oral pain medications.   Consults: neurology  Significant Diagnostic Studies:  Results for orders placed or performed during the hospital encounter of 05/05/21  Glucose, capillary  Result Value Ref Range   Glucose-Capillary 94 70 - 99 mg/dL  CBC  Result Value Ref Range   WBC 17.1 (H) 4.0 - 10.5 K/uL   RBC 3.07 (L) 3.87 - 5.11 MIL/uL   Hemoglobin 9.4 (L) 12.0 - 15.0 g/dL   HCT 28.0 (L) 36.0 - 46.0 %   MCV 91.2 80.0 - 100.0 fL   MCH 30.6 26.0 - 34.0 pg   MCHC 33.6 30.0 - 36.0 g/dL   RDW 13.6 11.5 - 15.5 %   Platelets 252 150 - 400 K/uL   nRBC 0.0 0.0 - 0.2 %  Basic metabolic panel  Result Value Ref Range   Sodium 136 135 - 145 mmol/L   Potassium 4.2 3.5 - 5.1 mmol/L   Chloride 107 98 - 111 mmol/L   CO2 20 (L) 22 - 32 mmol/L   Glucose, Bld 179 (H) 70 - 99 mg/dL   BUN 18 8 - 23  mg/dL   Creatinine, Ser 0.90 0.44 - 1.00 mg/dL   Calcium 8.9 8.9 - 10.3 mg/dL   GFR, Estimated >60 >60 mL/min   Anion gap 9 5 - 15  Glucose, capillary  Result Value Ref Range   Glucose-Capillary 168 (H) 70 - 99 mg/dL  Glucose, capillary  Result Value Ref Range   Glucose-Capillary 133 (H) 70 - 99 mg/dL  Glucose, capillary  Result Value Ref Range   Glucose-Capillary 91 70 - 99 mg/dL  Comprehensive metabolic panel  Result Value Ref Range   Sodium 138 135 - 145 mmol/L   Potassium 3.8 3.5 - 5.1 mmol/L   Chloride 107 98 - 111 mmol/L   CO2 23 22 - 32 mmol/L   Glucose, Bld 149 (H) 70 - 99 mg/dL   BUN 20 8 - 23 mg/dL   Creatinine, Ser 1.25 (H) 0.44 - 1.00 mg/dL   Calcium 8.5 (L) 8.9 - 10.3 mg/dL   Total Protein 5.2 (L) 6.5 - 8.1 g/dL   Albumin 3.2 (L) 3.5 - 5.0 g/dL   AST 23 15 - 41 U/L   ALT 10 0 - 44 U/L   Alkaline Phosphatase 32 (L) 38 - 126 U/L   Total Bilirubin 0.8 0.3 - 1.2 mg/dL   GFR, Estimated 45 (L) >60 mL/min   Anion gap 8 5 - 15  CBC with Differential/Platelet  Result Value Ref  Range   WBC 9.2 4.0 - 10.5 K/uL   RBC 2.46 (L) 3.87 - 5.11 MIL/uL   Hemoglobin 7.5 (L) 12.0 - 15.0 g/dL   HCT 22.9 (L) 36.0 - 46.0 %   MCV 93.1 80.0 - 100.0 fL   MCH 30.5 26.0 - 34.0 pg   MCHC 32.8 30.0 - 36.0 g/dL   RDW 14.0 11.5 - 15.5 %   Platelets 209 150 - 400 K/uL   nRBC 0.0 0.0 - 0.2 %   Neutrophils Relative % 78 %   Neutro Abs 7.2 1.7 - 7.7 K/uL   Lymphocytes Relative 14 %   Lymphs Abs 1.3 0.7 - 4.0 K/uL   Monocytes Relative 7 %   Monocytes Absolute 0.7 0.1 - 1.0 K/uL   Eosinophils Relative 1 %   Eosinophils Absolute 0.1 0.0 - 0.5 K/uL   Basophils Relative 0 %   Basophils Absolute 0.0 0.0 - 0.1 K/uL   Immature Granulocytes 0 %   Abs Immature Granulocytes 0.03 0.00 - 0.07 K/uL  Urinalysis, Complete w Microscopic Urine, Catheterized  Result Value Ref Range   Color, Urine YELLOW YELLOW   APPearance CLEAR CLEAR   Specific Gravity, Urine 1.016 1.005 - 1.030   pH 5.0 5.0 - 8.0    Glucose, UA NEGATIVE NEGATIVE mg/dL   Hgb urine dipstick SMALL (A) NEGATIVE   Bilirubin Urine NEGATIVE NEGATIVE   Ketones, ur NEGATIVE NEGATIVE mg/dL   Protein, ur NEGATIVE NEGATIVE mg/dL   Nitrite NEGATIVE NEGATIVE   Leukocytes,Ua TRACE (A) NEGATIVE   RBC / HPF 0-5 0 - 5 RBC/hpf   WBC, UA 0-5 0 - 5 WBC/hpf   Bacteria, UA RARE (A) NONE SEEN   Mucus PRESENT    Hyaline Casts, UA PRESENT   Vitamin B12  Result Value Ref Range   Vitamin B-12 288 180 - 914 pg/mL  Homocysteine  Result Value Ref Range   Homocysteine 9.3 0.0 - 19.2 umol/L  TSH  Result Value Ref Range   TSH 0.851 0.350 - 4.500 uIU/mL  RPR  Result Value Ref Range   RPR Ser Ql NON REACTIVE NON REACTIVE  Glucose, capillary  Result Value Ref Range   Glucose-Capillary 156 (H) 70 - 99 mg/dL  Glucose, capillary  Result Value Ref Range   Glucose-Capillary 86 70 - 99 mg/dL  CBC  Result Value Ref Range   WBC 11.1 (H) 4.0 - 10.5 K/uL   RBC 2.87 (L) 3.87 - 5.11 MIL/uL   Hemoglobin 8.8 (L) 12.0 - 15.0 g/dL   HCT 26.2 (L) 36.0 - 46.0 %   MCV 91.3 80.0 - 100.0 fL   MCH 30.7 26.0 - 34.0 pg   MCHC 33.6 30.0 - 36.0 g/dL   RDW 14.6 11.5 - 15.5 %   Platelets 199 150 - 400 K/uL   nRBC 0.0 0.0 - 0.2 %  Glucose, capillary  Result Value Ref Range   Glucose-Capillary 138 (H) 70 - 99 mg/dL  Glucose, capillary  Result Value Ref Range   Glucose-Capillary 109 (H) 70 - 99 mg/dL  Glucose, capillary  Result Value Ref Range   Glucose-Capillary 96 70 - 99 mg/dL  Glucose, capillary  Result Value Ref Range   Glucose-Capillary 130 (H) 70 - 99 mg/dL  Glucose, capillary  Result Value Ref Range   Glucose-Capillary 92 70 - 99 mg/dL  CBC with Differential/Platelet  Result Value Ref Range   WBC 13.8 (H) 4.0 - 10.5 K/uL   RBC 2.74 (L) 3.87 - 5.11 MIL/uL  Hemoglobin 8.3 (L) 12.0 - 15.0 g/dL   HCT 24.8 (L) 36.0 - 46.0 %   MCV 90.5 80.0 - 100.0 fL   MCH 30.3 26.0 - 34.0 pg   MCHC 33.5 30.0 - 36.0 g/dL   RDW 14.4 11.5 - 15.5 %    Platelets 218 150 - 400 K/uL   nRBC 0.0 0.0 - 0.2 %   Neutrophils Relative % 81 %   Neutro Abs 11.3 (H) 1.7 - 7.7 K/uL   Lymphocytes Relative 9 %   Lymphs Abs 1.2 0.7 - 4.0 K/uL   Monocytes Relative 8 %   Monocytes Absolute 1.0 0.1 - 1.0 K/uL   Eosinophils Relative 1 %   Eosinophils Absolute 0.1 0.0 - 0.5 K/uL   Basophils Relative 0 %   Basophils Absolute 0.1 0.0 - 0.1 K/uL   Immature Granulocytes 1 %   Abs Immature Granulocytes 0.09 (H) 0.00 - 0.07 K/uL  Glucose, capillary  Result Value Ref Range   Glucose-Capillary 80 70 - 99 mg/dL  Glucose, capillary  Result Value Ref Range   Glucose-Capillary 67 (L) 70 - 99 mg/dL  Glucose, capillary  Result Value Ref Range   Glucose-Capillary 123 (H) 70 - 99 mg/dL  Glucose, capillary  Result Value Ref Range   Glucose-Capillary 119 (H) 70 - 99 mg/dL  I-STAT, chem 8  Result Value Ref Range   Sodium 139 135 - 145 mmol/L   Potassium 3.7 3.5 - 5.1 mmol/L   Chloride 106 98 - 111 mmol/L   BUN 20 8 - 23 mg/dL   Creatinine, Ser 0.80 0.44 - 1.00 mg/dL   Glucose, Bld 127 (H) 70 - 99 mg/dL   Calcium, Ion 1.31 1.15 - 1.40 mmol/L   TCO2 23 22 - 32 mmol/L   Hemoglobin 9.9 (L) 12.0 - 15.0 g/dL   HCT 29.0 (L) 36.0 - 46.0 %  I-STAT 7, (LYTES, BLD GAS, ICA, H+H)  Result Value Ref Range   pH, Arterial 7.337 (L) 7.350 - 7.450   pCO2 arterial 44.1 32.0 - 48.0 mmHg   pO2, Arterial 143 (H) 83.0 - 108.0 mmHg   Bicarbonate 23.6 20.0 - 28.0 mmol/L   TCO2 25 22 - 32 mmol/L   O2 Saturation 99.0 %   Acid-base deficit 2.0 0.0 - 2.0 mmol/L   Sodium 138 135 - 145 mmol/L   Potassium 3.7 3.5 - 5.1 mmol/L   Calcium, Ion 1.29 1.15 - 1.40 mmol/L   HCT 30.0 (L) 36.0 - 46.0 %   Hemoglobin 10.2 (L) 12.0 - 15.0 g/dL   Sample type ARTERIAL   I-STAT, chem 8  Result Value Ref Range   Sodium 138 135 - 145 mmol/L   Potassium 3.7 3.5 - 5.1 mmol/L   Chloride 106 98 - 111 mmol/L   BUN 19 8 - 23 mg/dL   Creatinine, Ser 0.80 0.44 - 1.00 mg/dL   Glucose, Bld 174 (H) 70 -  99 mg/dL   Calcium, Ion 1.28 1.15 - 1.40 mmol/L   TCO2 23 22 - 32 mmol/L   Hemoglobin 9.9 (L) 12.0 - 15.0 g/dL   HCT 29.0 (L) 36.0 - 46.0 %  I-STAT 7, (LYTES, BLD GAS, ICA, H+H)  Result Value Ref Range   pH, Arterial 7.322 (L) 7.350 - 7.450   pCO2 arterial 42.1 32.0 - 48.0 mmHg   pO2, Arterial 148 (H) 83.0 - 108.0 mmHg   Bicarbonate 21.7 20.0 - 28.0 mmol/L   TCO2 23 22 - 32 mmol/L   O2 Saturation 99.0 %  Acid-base deficit 4.0 (H) 0.0 - 2.0 mmol/L   Sodium 138 135 - 145 mmol/L   Potassium 3.7 3.5 - 5.1 mmol/L   Calcium, Ion 1.26 1.15 - 1.40 mmol/L   HCT 31.0 (L) 36.0 - 46.0 %   Hemoglobin 10.5 (L) 12.0 - 15.0 g/dL   Sample type ARTERIAL   Prepare RBC (crossmatch)  Result Value Ref Range   Order Confirmation      ORDER PROCESSED BY BLOOD BANK Performed at [facility] [address], [place], [place] [address]   Prepare RBC (crossmatch)  Result Value Ref Range   Order Confirmation      ORDER PROCESSED BY BLOOD BANK Performed at [facility], [place] [address], [place], [place] [address]   Type and screen [place]  Result Value Ref Range   ABO/RH(D) A POS    Antibody Screen NEG    Sample Expiration 05/09/2021,2359    Unit Number [id number]    Blood Component Type RBC, LR IRR    Unit division 00    Status of Unit ISSUED,FINAL    Transfusion Status OK TO TRANSFUSE    Crossmatch Result      Compatible Performed at [facility], [place] [address], [place], [place] [address]   BPAM [facility]  Result Value Ref Range   ISSUE DATE / TIME [id number]    Blood Product Unit Number [id number]    PRODUCT CODE [id number]    Unit Type and Rh 6200    Blood Product Expiration Date [id number]     DG Lumbar Spine 2-3 Views  Result Date: 05/05/2021 CLINICAL DATA:  Surgical fusion of L1-2. EXAM: LUMBAR SPINE - 2-3 VIEW COMPARISON:  None. FINDINGS: Three intraoperative fluoroscopic images were obtained of the upper lumbar spine. These images  demonstrate the patient be status post surgical posterior fusion of L1-2 with bilateral intrapedicular screw placement interbody fusion. IMPRESSION: Fluoroscopic guidance provided during surgical fusion of L1-2. Electronically Signed   By: Marijo Conception M.D.   On: 05/05/2021 13:07   CT HEAD WO CONTRAST  Result Date: 05/05/2021 CLINICAL DATA:  Mental status change, unknown cause. Additional history provided: Mental status change status post lumbar surgery today. EXAM: CT HEAD WITHOUT CONTRAST TECHNIQUE: Contiguous axial images were obtained from the base of the skull through the vertex without intravenous contrast. COMPARISON:  No pertinent prior exams available for comparison. FINDINGS: Brain: Mild generalized cerebral atrophy. There is no acute intracranial hemorrhage. No demarcated cortical infarct. No extra-axial fluid collection. No evidence of an intracranial mass. No midline shift. Partially empty sella turcica. Vascular: No hyperdense vessel. Skull: No calvarial fracture or focal suspicious osseous lesion. Sinuses/Orbits: Visualized orbits show no acute finding. Trace mucosal thickening within the bilateral ethmoid and left maxillary sinuses at the imaged levels. Small fluid levels within the bilateral sphenoid sinuses. IMPRESSION: No evidence of acute intracranial abnormality. Mild generalized cerebral atrophy. Paranasal sinus disease, as described. Electronically Signed   By: Kellie Simmering DO   On: 05/05/2021 20:28   CT Angio Chest Pulmonary Embolism (PE) W or WO Contrast  Result Date: 05/06/2021 CLINICAL DATA:  High probability for pulmonary embolus. EXAM: CT ANGIOGRAPHY CHEST WITH CONTRAST TECHNIQUE: Multidetector CT imaging of the chest was performed using the standard protocol during bolus administration of intravenous contrast. Multiplanar CT image reconstructions and MIPs were obtained to evaluate the vascular anatomy. CONTRAST:  32m OMNIPAQUE IOHEXOL 350 MG/ML SOLN COMPARISON:  None.  FINDINGS: Cardiovascular: Satisfactory opacification of the  pulmonary arteries to the segmental level. No evidence of pulmonary embolism. Normal heart size. No pericardial effusion. Mediastinum/Nodes: No enlarged mediastinal, hilar, or axillary lymph nodes. Thyroid gland, trachea, and esophagus demonstrate no significant findings. Lungs/Pleura: No pneumothorax or pleural effusion is noted. Minimal bilateral posterior basilar subsegmental atelectasis is noted. Upper Abdomen: No acute abnormality. Musculoskeletal: No chest wall abnormality. No acute or significant osseous findings. Review of the MIP images confirms the above findings. IMPRESSION: No definite evidence of pulmonary embolus. No significant abnormality seen in the chest. Aortic Atherosclerosis (ICD10-I70.0). Electronically Signed   By: Marijo Conception M.D.   On: 05/06/2021 19:51   MR BRAIN WO CONTRAST  Result Date: 05/06/2021 CLINICAL DATA:  Neuro deficit with acute stroke suspected EXAM: MRI HEAD WITHOUT CONTRAST TECHNIQUE: Multiplanar, multiecho pulse sequences of the brain and surrounding structures were obtained without intravenous contrast. COMPARISON:  Yesterday FINDINGS: Brain: No acute infarction, hemorrhage, hydrocephalus, extra-axial collection or mass lesion. Vascular: Normal flow voids. Skull and upper cervical spine: Normal marrow signal. Sinuses/Orbits: Negative. Other: Progressively motion degraded scan with multiple nondiagnostic images. IMPRESSION: 1. Significantly motion degraded study. 2. Negative for infarct or other acute finding. Electronically Signed   By: Monte Fantasia M.D.   On: 05/06/2021 11:40   DG CHEST PORT 1 VIEW  Result Date: 05/06/2021 CLINICAL DATA:  Shortness of breath. EXAM: PORTABLE CHEST 1 VIEW COMPARISON:  Single-view of the chest 05/05/2021. FINDINGS: Right IJ central venous catheter is unchanged. Lungs are clear. Heart size is normal. No pneumothorax or pleural fluid. No acute or focal bony abnormality.  IMPRESSION: No acute disease. Electronically Signed   By: Inge Rise M.D.   On: 05/06/2021 13:45   DG CHEST PORT 1 VIEW  Result Date: 05/05/2021 CLINICAL DATA:  Central venous catheter placement EXAM: PORTABLE CHEST 1 VIEW COMPARISON:  08/12/2010 FINDINGS: Right internal jugular central venous catheter is in place with its tip at the superior cavoatrial junction. Lungs are clear. No pneumothorax or pleural effusion. Cardiac size within normal limits. Pulmonary vascularity is normal. Surgical drainage catheter overlies the epigastrium and right upper quadrant. IMPRESSION: Central venous catheter tip within the superior cavoatrial junction. No pneumothorax. Electronically Signed   By: Fidela Salisbury MD   On: 05/05/2021 18:29   EEG adult  Result Date: 05/06/2021 Lora Havens, MD     05/06/2021  4:17 PM Patient Name: Tammy Hicks MRN: [id number] Epilepsy Attending: Lora Havens Referring Physician/Provider: Dr Mitzi Hansen Date: 05/06/2021 Duration: 23.48 mins Patient history: 75 year old female with new speech disturbance.  EEG to evaluate for seizures. Level of alertness: awake AEDs during EEG study: GBP Technical aspects: This EEG study was done with scalp electrodes positioned according to the 10-20 International system of electrode placement. Electrical activity was acquired at a sampling rate of 500Hz and reviewed with a high frequency filter of 70Hz and a low frequency filter of 1Hz. EEG data were recorded continuously and digitally stored. Description: No clear posterior dominant rhythm was seen. EEG showed continuous generalized slowing. Intermittent Generalized periodic discharges with triphasic morphology at 1Hz were also noted.  Hyperventilation and photic stimulation were not performed.   ABNORMALITY - Periodic discharges with triphasic morphology, generalized ( GPDs) - Continuous slow, generalized IMPRESSION: This study showed generalized periodic discharges with triphasic morphology  at 1 Hz which can on the ictal-interictal continuum with low potential for seizures.  Although, this semiology and morphology is more commonly seen in toxic-metabolic causes. Additionally there is moderate diffuse encephalopathy, nonspecific etiology.  No seizures were seen throughout the recording. Lora Havens   DG C-Arm [phone] Min  Result Date: 05/05/2021 CLINICAL DATA:  Lumbar fusion. EXAM: DG C-ARM 1-60 MIN CONTRAST:  None. FLUOROSCOPY TIME:  Number of Acquired Spot Images: 3. COMPARISON:  None. FINDINGS: Status post surgical posterior fusion of L2-3 with interbody fusion. Postsurgical changes are also seen involving L4-5 and L5-S1. Surgical clips are seen projected over the sacrum. No other radiopaque foreign body is noted. IMPRESSION: Postsurgical changes as described above. No other radiopaque foreign body seen. These results were called by telephone at the time of interpretation on 05/05/2021 at 12:23 pm to [place] in [place], who verbally acknowledged these results. Electronically Signed   By: Marijo Conception M.D.   On: 05/05/2021 12:24   DG OR LOCAL ABDOMEN  Result Date: 05/05/2021 CLINICAL DATA:  L5-S1 fusion. Protocol assessment for retained foreign body. EXAM: OR LOCAL ABDOMEN COMPARISON:  06/24/2017 FINDINGS: One view portable AP film of the lumbar spine shows posterior fusion hardware at L2-3 with spinous process fixation devices at L3-4 and L4-5. Anterior fusion hardware visible now at L5-S1. No evidence for unexpected soft tissue foreign body. IMPRESSION: No unexpected foreign body. Report was called directly to [place], Denyse Amass, at 1029 hours on 05/05/2021. Electronically Signed   By: Misty Stanley M.D.   On: 05/05/2021 10:29    Antibiotics:  Anti-infectives (From admission, onward)    Start     Dose/Rate Route Frequency Ordered Stop   05/05/21 2200  ceFAZolin (ANCEF) IVPB 2g/100 mL premix        2 g 200 mL/hr over 30 Minutes Intravenous Every 8 hours 05/05/21 2104 05/07/21 1442    05/05/21 0600  ceFAZolin (ANCEF) IVPB 2g/100 mL premix        2 g 200 mL/hr over 30 Minutes Intravenous On call to O.R. 05/05/21 0552 05/05/21 1402       Discharge Exam: Blood pressure (!) 96/57, pulse 86, temperature 98.9 F (37.2 C), temperature source Oral, resp. rate (!) 26, height 5' 3" (1.6 m), weight 73 kg, SpO2 92 %. Neurologic: Grossly normal Ambulating and voiding well, incision cdi   Discharge Medications:   Allergies as of 05/09/2021       Reactions   Pioglitazone Swelling, Other (See Comments)   Leg swelling Fatigue AKA: ACTOS  Leg swelling   Primidone Palpitations, Shortness Of Breath, Other (See Comments)   DYSPNEA   Flexeril [cyclobenzaprine] Other (See Comments)   Confusion   Codeine Nausea Only, Nausea And Vomiting   Severe nausea         Medication List     TAKE these medications    acetaminophen 500 MG tablet Commonly known as: TYLENOL Take 1,000-1,500 mg by mouth every 4 (four) hours as needed for moderate pain.   ACETAMINOPHEN-BUTALBITAL 50-325 MG Tabs Take 1 tablet by mouth every 4 (four) hours as needed (headache).   aspirin 81 MG EC tablet Take 81 mg by mouth daily.   azelastine 0.1 % nasal spray Commonly known as: ASTELIN Place 2 sprays into both nostrils 2 (two) times daily as needed for rhinitis or allergies.   desvenlafaxine 50 MG 24 hr tablet Commonly known as: PRISTIQ Take 50 mg by mouth daily.   Estradiol 10 MCG Tabs vaginal tablet Place 1 tablet vaginally 2 (two) times a week.   fluticasone 50 MCG/ACT nasal spray Commonly known as: FLONASE Place 1 spray into both nostrils daily as needed for allergies.   furosemide 40 MG  tablet Commonly known as: LASIX Take 40 mg by mouth daily as needed for fluid.   gabapentin 100 MG capsule Commonly known as: NEURONTIN Take 100 mg by mouth 3 (three) times daily.   glipiZIDE 5 MG 24 hr tablet Commonly known as: GLUCOTROL XL Take 5 mg by mouth 2 (two) times daily.   hydrOXYzine  10 MG tablet Commonly known as: ATARAX/VISTARIL Take 10 mg by mouth every 8 (eight) hours as needed for anxiety.   insulin regular 100 units/mL injection Commonly known as: NOVOLIN R Inject 2-8 Units into the skin 3 (three) times daily as needed for high blood sugar.   Insulin Syringe-Needle U-100 25G X 1" 1 ML Misc Use 3 times a day as needed   B-D INS SYR ULTRAFINE 1CC/30G 30G X 1/2" 1 ML Misc Generic drug: Insulin Syringe-Needle U-100 See admin instructions.   meclizine 25 MG tablet Commonly known as: ANTIVERT Take 25 mg by mouth every 6 (six) hours as needed for dizziness.   metFORMIN 500 MG tablet Commonly known as: GLUCOPHAGE Take 500 mg by mouth 2 (two) times daily with a meal.   olmesartan-hydrochlorothiazide 40-25 MG tablet Commonly known as: BENICAR HCT Take 1 tablet by mouth daily.   omeprazole 20 MG capsule Commonly known as: PRILOSEC Take 20 mg by mouth daily.   ONE TOUCH ULTRA 2 w/Device Kit Use as instructed. Check blood sugar 2 times per day. Dx. N39.7   OneTouch Delica Lancets 67H Misc 1 each by Other route 2 times daily. Dx E11.9   oxyCODONE-acetaminophen 10-325 MG tablet Commonly known as: PERCOCET Take 1 tablet by mouth every 4 (four) hours as needed for pain. What changed: Another medication with the same name was added. Make sure you understand how and when to take each.   oxyCODONE-acetaminophen 5-325 MG tablet Commonly known as: Percocet Take 1 tablet by mouth every 4 (four) hours as needed for severe pain. What changed: You were already taking a medication with the same name, and this prescription was added. Make sure you understand how and when to take each.   potassium chloride SA 20 MEQ tablet Commonly known as: KLOR-CON Take 20 mEq by mouth 2 (two) times daily.   pramipexole 1 MG tablet Commonly known as: MIRAPEX Take 1 mg by mouth at bedtime.   pravastatin 10 MG tablet Commonly known as: PRAVACHOL Take 10 mg by mouth daily.    sitaGLIPtin 100 MG tablet Commonly known as: JANUVIA Take 100 mg by mouth daily.   tiZANidine 2 MG tablet Commonly known as: ZANAFLEX Take 1 tablet (2 mg total) by mouth 3 (three) times daily.   traMADol 50 MG tablet Commonly known as: ULTRAM Take 50-100 mg by mouth every 6 (six) hours as needed for severe pain.   Vitamin D (Ergocalciferol) 1.25 MG (50000 UNIT) Caps capsule Commonly known as: DRISDOL Take 50,000 Units by mouth once a week.   zolpidem 12.5 MG CR tablet Commonly known as: AMBIEN CR Take 12.5 mg by mouth at bedtime as needed for sleep.        Disposition: home   Final Dx: XLIF L1-2, ALIF L5-S1, posterior hardware placement L1-2, L5-ileum.   Discharge Instructions     Call MD for:  difficulty breathing, headache or visual disturbances   Complete by: As directed    Call MD for:  hives   Complete by: As directed    Call MD for:  persistant dizziness or light-headedness   Complete by: As directed    Call  MD for:  persistant nausea and vomiting   Complete by: As directed    Call MD for:  redness, tenderness, or signs of infection (pain, swelling, redness, odor or green/yellow discharge around incision site)   Complete by: As directed    Call MD for:  severe uncontrolled pain   Complete by: As directed    Call MD for:  temperature >100.4   Complete by: As directed    Diet - low sodium heart healthy   Complete by: As directed    Driving Restrictions   Complete by: As directed    No driving for 2 weeks, no riding in the car for 1 week   Increase activity slowly   Complete by: As directed    Lifting restrictions   Complete by: As directed    No lifting more than 8 lbs   Remove dressing in 24 hours   Complete by: As directed           Signed: Ocie Cornfield  05/09/2021, 11:53 AM

## 2021-05-09 NOTE — Progress Notes (Signed)
Physical Therapy Treatment Patient Details Name: Tammy Hicks MRN: [id number] DOB: 01-16-1946 Today's Date: 05/09/2021    History of Present Illness 75 yo female ALIF L5-S1 anterior retroperitoneal approach, XLIF L1-2, L5-S1 lumbar laminectomy, microdiscectomy, and posterior screws with pelvic fixation with iliac screws. PMH anxiety, CKDIII DM Depression, migraines, Hx kidney stones, HTN, OA, insomnia, PONV, restless leg syndrom Sleep apnea uses Cpap back surgery x2,    PT Comments    The pt was eager and willing to participate in PT session this morning with focus on continued progression of OOB mobility, stairs training, and education. The pt remains eager to return home, and was able to demo good bed mobility from flat bed without assist or use of bed rails. The pt was also able to complete sit-stand transfers and hallway ambulation on minG level with use of RW. She does require BUE support for stair navigation, but was able to complete with DME in one hand and HHA in the other with good stability and minimal change in pain. The pt and her spouse were educated on progressive walking program, sleeping positions, and  home set up for reduced exertion, pain control, and safety. All expressed understanding. Pt will be safe to return home with family assist and continued therapies in home to further progress strength and mobility.    Follow Up Recommendations  Home health PT;Supervision for mobility/OOB     Equipment Recommendations  None recommended by PT (pt well equipped)    Recommendations for Other Services       Precautions / Restrictions Precautions Precautions: Back Precaution Booklet Issued: Yes (comment) Precaution Comments: pt able to recall 2/3 verbally Required Braces or Orthoses: Spinal Brace Spinal Brace: Lumbar corset;Applied in sitting position Restrictions Weight Bearing Restrictions: No    Mobility  Bed Mobility Overal bed mobility: Needs Assistance Bed Mobility:  Sidelying to Sit;Rolling Rolling: Supervision Sidelying to sit: Supervision       General bed mobility comments: pt able to complete from [facility] 10 deg with no rail or assist    Transfers Overall transfer level: Needs assistance Equipment used: Rolling walker (2 wheeled) Transfers: Sit to/from Stand Sit to Stand: Min guard         General transfer comment: VC for hand placement, pt able to power up without assist  Ambulation/Gait Ambulation/Gait assistance: Min guard Gait Distance (Feet): 250 Feet Assistive device: Rolling walker (2 wheeled) Gait Pattern/deviations: Step-through pattern;Decreased stride length;Trunk flexed Gait velocity: 0.4 m/s Gait velocity interpretation: 1.31 - 2.62 ft/sec, indicative of limited community ambulator General Gait Details: slight trunk flexion, no overt LOB or need for physical assist today. does require BUE support and intermittent cues for posture   Stairs Stairs: Yes Stairs assistance: Min assist Stair Management: One rail Right;Step to pattern;Forwards Number of Stairs: 2 General stair comments: able to step up with either LE, no significant change in pain      Balance Overall balance assessment: Needs assistance Sitting-balance support: Bilateral upper extremity supported;Feet supported Sitting balance-Leahy Scale: Fair     Standing balance support: Bilateral upper extremity supported Standing balance-Leahy Scale: Poor Standing balance comment: reliant on external support                            Cognition Arousal/Alertness: Awake/alert Behavior During Therapy: WFL for tasks assessed/performed Overall Cognitive Status: Impaired/Different from baseline Area of Impairment: Memory  Memory: Decreased recall of precautions;Decreased short-term memory         General Comments: pt with slight lapses in memory during session, able to follow all commands and recal education provided  during session      Exercises      General Comments General comments (skin integrity, edema, etc.): VSS      Pertinent Vitals/Pain Pain Assessment: 0-10 Pain Score: 4  Pain Location: 6-7 in LLE prior to session, 4/10 with ambulation Pain Descriptors / Indicators: Discomfort;Grimacing;Guarding Pain Intervention(s): Limited activity within patient's tolerance;Monitored during session;Repositioned;Premedicated before session     PT Goals (current goals can now be found in the care plan section) Acute Rehab PT Goals Patient Stated Goal: to go home and recover there PT Goal Formulation: With patient Time For Goal Achievement: 05/20/21 Potential to Achieve Goals: Good Progress towards PT goals: Progressing toward goals    Frequency    Min 5X/week      PT Plan Current plan remains appropriate       AM-PAC PT "6 Clicks" Mobility   Outcome Measure  Help needed turning from your back to your side while in a flat bed without using bedrails?: A Little Help needed moving from lying on your back to sitting on the side of a flat bed without using bedrails?: A Little Help needed moving to and from a bed to a chair (including a wheelchair)?: A Little Help needed standing up from a chair using your arms (e.g., wheelchair or bedside chair)?: A Little Help needed to walk in hospital room?: A Little Help needed climbing 3-5 steps with a railing? : A Little 6 Click Score: 18    End of Session Equipment Utilized During Treatment: Gait belt;Back brace Activity Tolerance: Patient tolerated treatment well Patient left:  (in room with OT) Nurse Communication: Mobility status PT Visit Diagnosis: Unsteadiness on feet (R26.81);Difficulty in walking, not elsewhere classified (R26.2);Pain     Time: 0881-1031 PT Time Calculation (min) (ACUTE ONLY): 24 min  Charges:  $Gait Training: 8-22 mins $Self Care/Home Management: 8-22                     Lazarus Gowda, PT, DPT   Acute  Rehabilitation Department Pager #: [phone]   Gaetana Michaelis 05/09/2021, 10:00 AM

## 2022-01-31 IMAGING — CT CT HEAD W/O CM
4 series · 16 of 47 positions shown, 18 images · non-contrast
Comparison: No pertinent prior exams available for comparison.

CLINICAL DATA: Mental status change, unknown cause. Additional
history provided: Mental status change status post lumbar surgery
today.

EXAM:
CT HEAD WITHOUT CONTRAST
TECHNIQUE: Contiguous axial images were obtained from the base of the skull
through the vertex without intravenous contrast.

[Series 3: head without · axial · non-contrast · 0.44mm/px · z∈[-132,-12]mm · 7 of 32 slices shown, 9 images]
[im 4/32  brain]
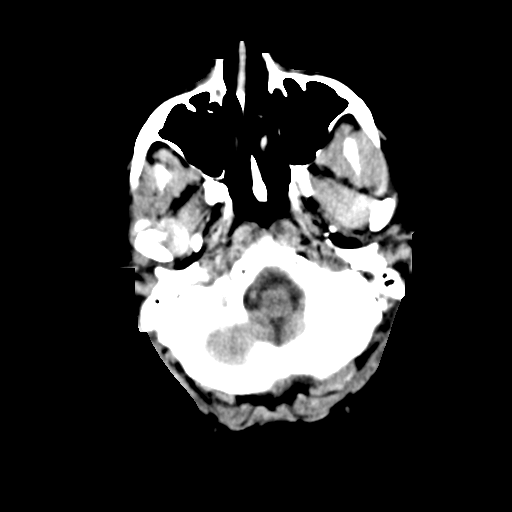
[im 4/32  bone]
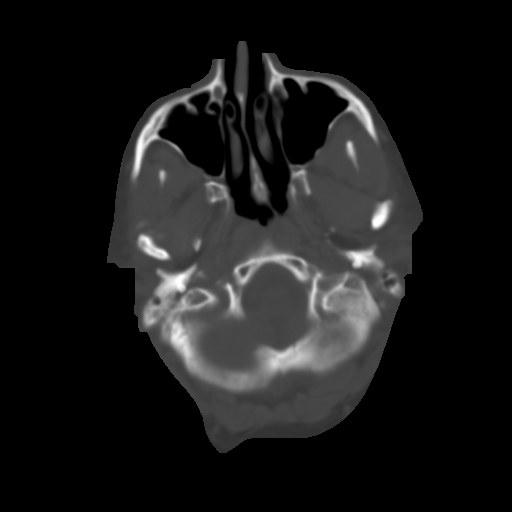
[im 8/32  brain]
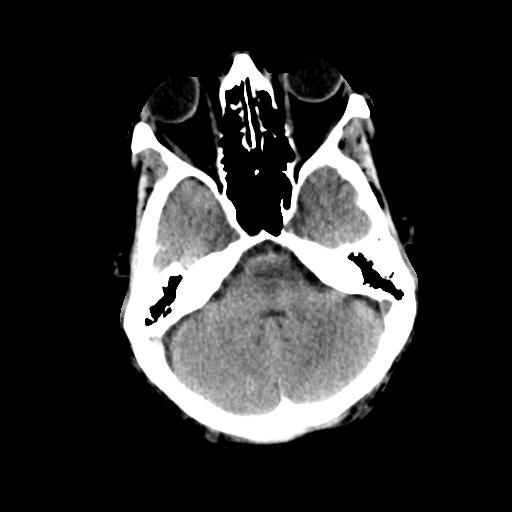
[im 12/32  brain]
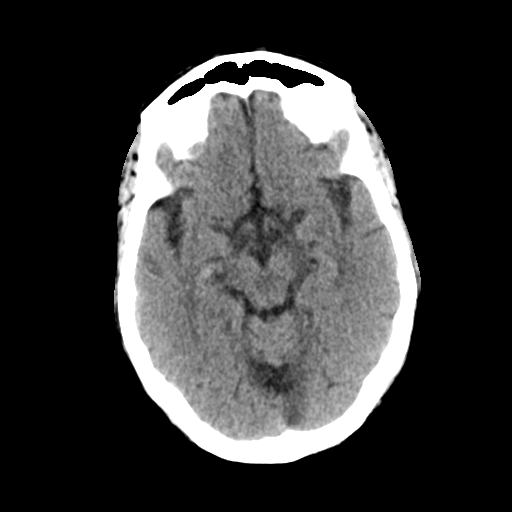
[im 16/32  brain]
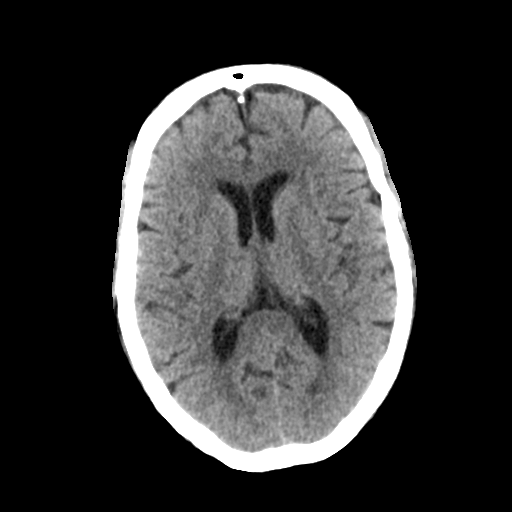
[im 20/32  brain]
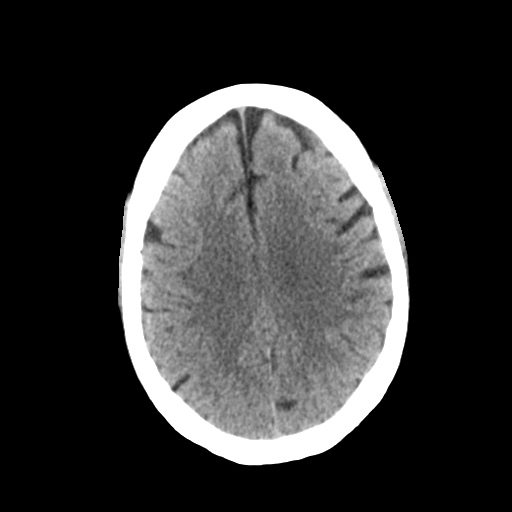
[im 20/32  bone]
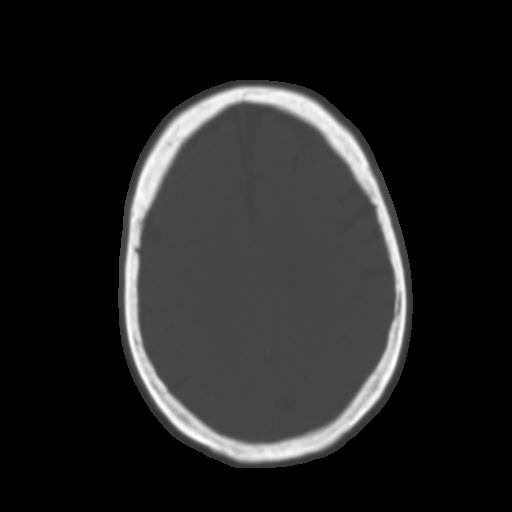
[im 24/32  brain]
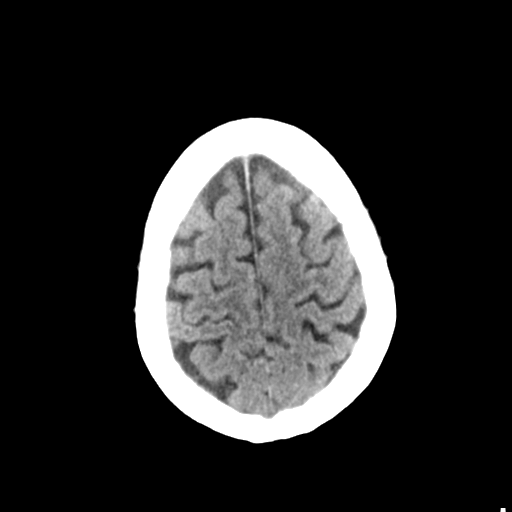
[im 28/32  brain]
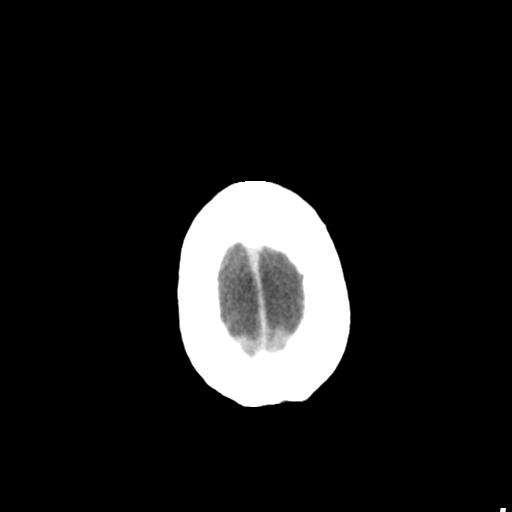

[Series 4: head bone · axial · 0.44mm/px · z∈[-133,-101]mm · 3 of 79 slices shown]
[im 8/79  bone]
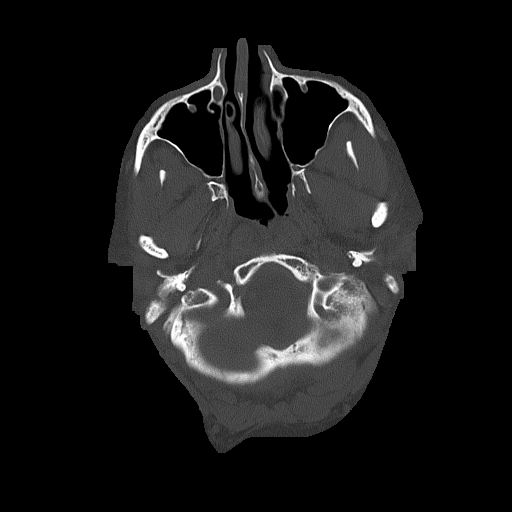
[im 16/79  bone]
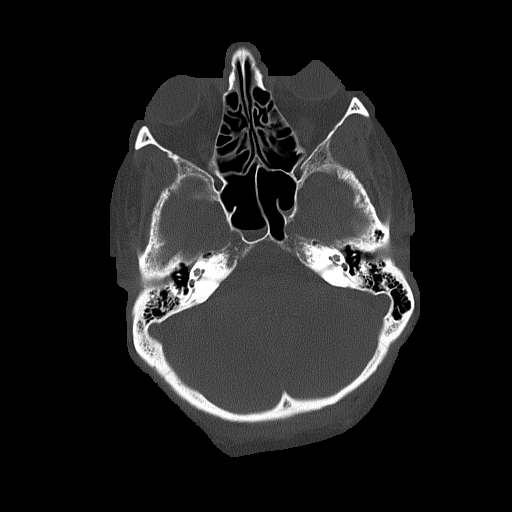
[im 24/79  bone]
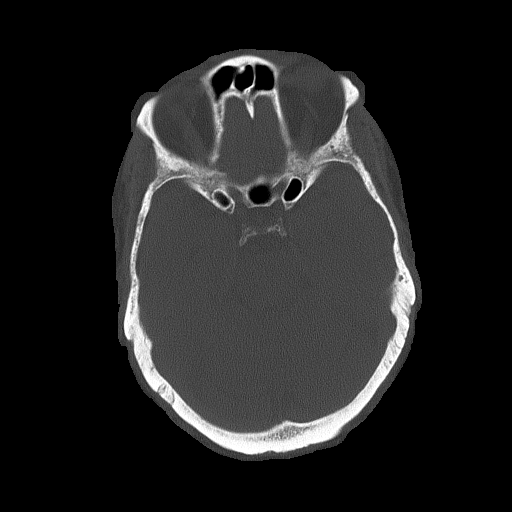

[Series 5: head without cor · coronal · non-contrast · 0.33mm/px · 3 of 67 slices shown]
[im 23/67  brain]
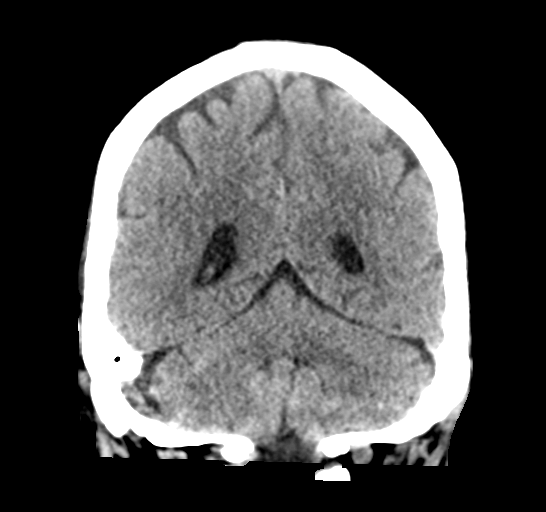
[im 30/67  brain]
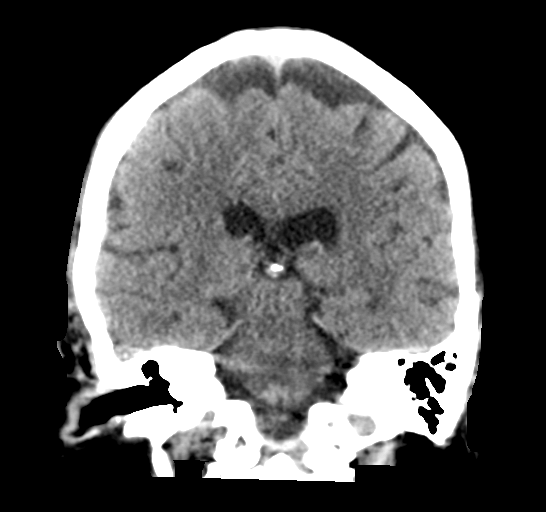
[im 37/67  brain]
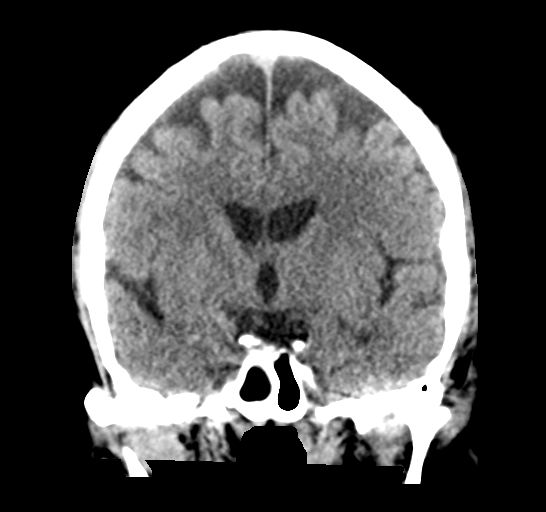

[Series 6: head without sag · sagittal · non-contrast · 0.31mm/px · 3 of 66 slices shown]
[im 22/66  brain]
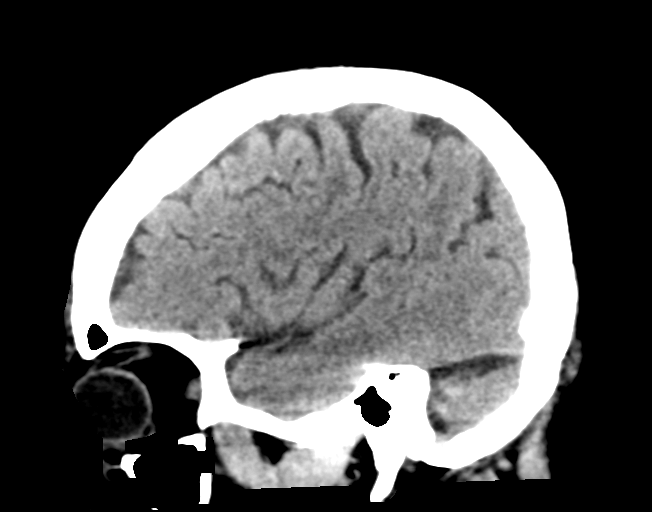
[im 33/66  brain]
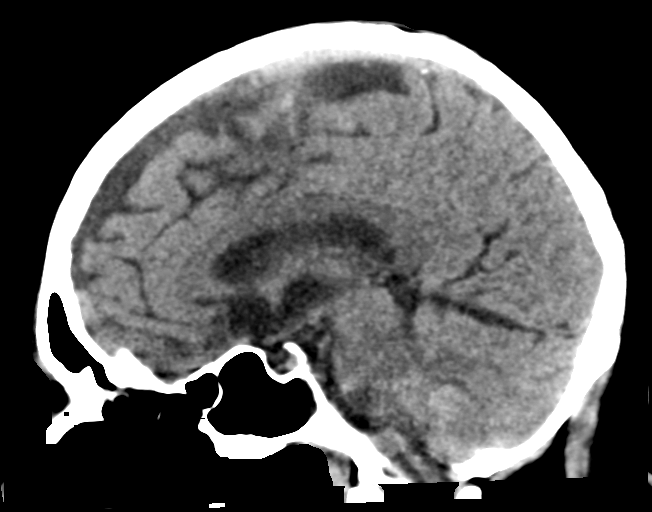
[im 44/66  brain]
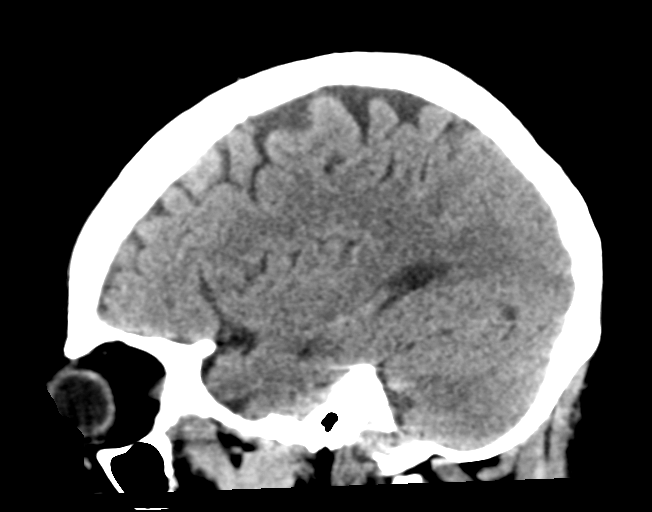

[16 of 47 positions shown; findings below may reference images not displayed]

FINDINGS: Brain:

Mild generalized cerebral atrophy.

There is no acute intracranial hemorrhage.

No demarcated cortical infarct.

No extra-axial fluid collection.

No evidence of an intracranial mass.

No midline shift.

Partially empty sella turcica.

Vascular: No hyperdense vessel.

Skull: No calvarial fracture or focal suspicious osseous lesion.

Sinuses/Orbits: Visualized orbits show no acute finding. Trace
mucosal thickening within the bilateral ethmoid and left maxillary
sinuses at the imaged levels. Small fluid levels within the
bilateral sphenoid sinuses.
IMPRESSION: No evidence of acute intracranial abnormality.

Mild generalized cerebral atrophy.

Paranasal sinus disease, as described.

## 2022-12-02 HISTORY — PX: TOTAL KNEE ARTHROPLASTY: SHX125

## 2023-04-13 ENCOUNTER — Other Ambulatory Visit: Payer: Self-pay | Admitting: Neurosurgery

## 2023-05-19 NOTE — Pre-Procedure Instructions (Signed)
Surgical Instructions   Your procedure is scheduled on Monday, August 12th. Report to [facility] Main Entrance "A" at 05:30 A.M., then check in with the Admitting office. Any questions or running late day of surgery: call [phone]  Questions prior to your surgery date: call [phone], Monday-Friday, 8am-4pm. If you experience any cold or flu symptoms such as cough, fever, chills, shortness of breath, etc. between now and your scheduled surgery, please notify us at the above number.     Remember:  Do not eat or drink after midnight the night before your surgery     Take these medicines the morning of surgery with A SIP OF WATER  busPIRone (BUSPAR)  desvenlafaxine (PRISTIQ)  gabapentin (NEURONTIN)  omeprazole (PRILOSEC)  pravastatin (PRAVACHOL)    May take these medicines IF NEEDED: acetaminophen (TYLENOL)  fluticasone (FLONASE)  meclizine (ANTIVERT)  oxyCODONE-acetaminophen (PERCOCET)    Follow your surgeon's instructions on when to stop Aspirin.  If no instructions were given by your surgeon then you will need to call the office to get those instructions.     One week prior to surgery, STOP taking any Aleve, Naproxen, Ibuprofen, Motrin, Advil, Goody's, BC's, all herbal medications, fish oil, and non-prescription vitamins.  WHAT DO I DO ABOUT MY DIABETES MEDICATION?   Do not take metFORMIN (GLUCOPHAGE-XR) the morning of surgery.  If your CBG is greater than 220 mg/dL, you may take  of your sliding scale (correction) dose of insulin regular (NOVOLIN R).   HOW TO MANAGE YOUR DIABETES BEFORE AND AFTER SURGERY  Why is it important to control my blood sugar before and after surgery? Improving blood sugar levels before and after surgery helps healing and can limit problems. A way of improving blood sugar control is eating a healthy diet by:  Eating less sugar and carbohydrates  Increasing activity/exercise  Talking with your doctor about reaching your blood sugar  goals High blood sugars (greater than 180 mg/dL) can raise your risk of infections and slow your recovery, so you will need to focus on controlling your diabetes during the weeks before surgery. Make sure that the doctor who takes care of your diabetes knows about your planned surgery including the date and location.  How do I manage my blood sugar before surgery? Check your blood sugar at least 4 times a day, starting 2 days before surgery, to make sure that the level is not too high or low.  Check your blood sugar the morning of your surgery when you wake up and every 2 hours until you get to the Short Stay unit.  If your blood sugar is less than 70 mg/dL, you will need to treat for low blood sugar: Do not take insulin. Treat a low blood sugar (less than 70 mg/dL) with  cup of clear juice (cranberry or apple), 4 glucose tablets, OR glucose gel. Recheck blood sugar in 15 minutes after treatment (to make sure it is greater than 70 mg/dL). If your blood sugar is not greater than 70 mg/dL on recheck, call [phone] for further instructions. Report your blood sugar to the short stay nurse when you get to Short Stay.  If you are admitted to the hospital after surgery: Your blood sugar will be checked by the staff and you will probably be given insulin after surgery (instead of oral diabetes medicines) to make sure you have good blood sugar levels. The goal for blood sugar control after surgery is 80-180 mg/dL.  Do NOT Smoke (Tobacco/Vaping) for 24 hours prior to your procedure.  If you use a CPAP at night, you may bring your mask/headgear for your overnight stay.   You will be asked to remove any contacts, glasses, piercing's, hearing aid's, dentures/partials prior to surgery. Please bring cases for these items if needed.    Patients discharged the day of surgery will not be allowed to drive home, and someone needs to stay with them for 24 hours.  SURGICAL WAITING ROOM  VISITATION Patients may have no more than 2 support people in the waiting area - these visitors may rotate.   Pre-op nurse will coordinate an appropriate time for 1 ADULT support person, who may not rotate, to accompany patient in pre-op.  Children under the age of 45 must have an adult with them who is not the patient and must remain in the main waiting area with an adult.  If the patient needs to stay at the hospital during part of their recovery, the visitor guidelines for inpatient rooms apply.  Please refer to the [facility] website for the visitor guidelines for any additional information.   If you received a COVID test during your pre-op visit  it is requested that you wear a mask when out in public, stay away from anyone that may not be feeling well and notify your surgeon if you develop symptoms. If you have been in contact with anyone that has tested positive in the last 10 days please notify you surgeon.      Pre-operative 5 CHG Bathing Instructions   You can play a key role in reducing the risk of infection after surgery. Your skin needs to be as free of germs as possible. You can reduce the number of germs on your skin by washing with CHG (chlorhexidine gluconate) soap before surgery. CHG is an antiseptic soap that kills germs and continues to kill germs even after washing.   DO NOT use if you have an allergy to chlorhexidine/CHG or antibacterial soaps. If your skin becomes reddened or irritated, stop using the CHG and notify one of our RNs at [phone].   Please shower with the CHG soap starting 4 days before surgery using the following schedule:     Please keep in mind the following:  DO NOT shave, including legs and underarms, starting the day of your first shower.   You may shave your face at any point before/day of surgery.  Place clean sheets on your bed the day you start using CHG soap. Use a clean washcloth (not used since being washed) for each shower. DO NOT  sleep with pets once you start using the CHG.   CHG Shower Instructions:  If you choose to wash your hair and private area, wash first with your normal shampoo/soap.  After you use shampoo/soap, rinse your hair and body thoroughly to remove shampoo/soap residue.  Turn the water OFF and apply about 3 tablespoons (45 ml) of CHG soap to a CLEAN washcloth.  Apply CHG soap ONLY FROM YOUR NECK DOWN TO YOUR TOES (washing for 3-5 minutes)  DO NOT use CHG soap on face, private areas, open wounds, or sores.  Pay special attention to the area where your surgery is being performed.  If you are having back surgery, having someone wash your back for you may be helpful. Wait 2 minutes after CHG soap is applied, then you may rinse off the CHG soap.  Pat dry with a clean towel  Put on clean  clothes/pajamas   If you choose to wear lotion, please use ONLY the CHG-compatible lotions on the back of this paper.   Additional instructions for the day of surgery: DO NOT APPLY any lotions, deodorants, cologne, or perfumes.   Do not bring valuables to the hospital. [facility] is not responsible for any belongings/valuables. Do not wear nail polish, gel polish, artificial nails, or any other type of covering on natural nails (fingers and toes) Do not wear jewelry or makeup Put on clean/comfortable clothes.  Please brush your teeth.  Ask your nurse before applying any prescription medications to the skin.     CHG Compatible Lotions   Aveeno Moisturizing lotion  Cetaphil Moisturizing Cream  Cetaphil Moisturizing Lotion  Clairol Herbal Essence Moisturizing Lotion, Dry Skin  Clairol Herbal Essence Moisturizing Lotion, Extra Dry Skin  Clairol Herbal Essence Moisturizing Lotion, Normal Skin  Curel Age Defying Therapeutic Moisturizing Lotion with Alpha Hydroxy  Curel Extreme Care Body Lotion  Curel Soothing Hands Moisturizing Hand Lotion  Curel Therapeutic Moisturizing Cream, Fragrance-Free  Curel Therapeutic  Moisturizing Lotion, Fragrance-Free  Curel Therapeutic Moisturizing Lotion, Original Formula  Eucerin Daily Replenishing Lotion  Eucerin Dry Skin Therapy Plus Alpha Hydroxy Crme  Eucerin Dry Skin Therapy Plus Alpha Hydroxy Lotion  Eucerin Original Crme  Eucerin Original Lotion  Eucerin Plus Crme Eucerin Plus Lotion  Eucerin TriLipid Replenishing Lotion  Keri Anti-Bacterial Hand Lotion  Keri Deep Conditioning Original Lotion Dry Skin Formula Softly Scented  Keri Deep Conditioning Original Lotion, Fragrance Free Sensitive Skin Formula  Keri Lotion Fast Absorbing Fragrance Free Sensitive Skin Formula  Keri Lotion Fast Absorbing Softly Scented Dry Skin Formula  Keri Original Lotion  Keri Skin Renewal Lotion Keri Silky Smooth Lotion  Keri Silky Smooth Sensitive Skin Lotion  Nivea Body Creamy Conditioning Oil  Nivea Body Extra Enriched Lotion  Nivea Body Original Lotion  Nivea Body Sheer Moisturizing Lotion Nivea Crme  Nivea Skin Firming Lotion  NutraDerm 30 Skin Lotion  NutraDerm Skin Lotion  NutraDerm Therapeutic Skin Cream  NutraDerm Therapeutic Skin Lotion  ProShield Protective Hand Cream  Provon moisturizing lotion  Please read over the following fact sheets that you were given.

## 2023-05-20 ENCOUNTER — Encounter (HOSPITAL_COMMUNITY)
Admission: RE | Admit: 2023-05-20 | Discharge: 2023-05-20 | Disposition: A | Discharge status: Home / Self Care | Payer: Medicare Other | Source: Ambulatory Visit | Attending: Neurosurgery | Admitting: Neurosurgery

## 2023-05-20 ENCOUNTER — Encounter (HOSPITAL_COMMUNITY): Payer: Self-pay

## 2023-05-20 ENCOUNTER — Other Ambulatory Visit: Payer: Self-pay

## 2023-05-20 VITALS — BP 135/71 | HR 84 | Temp 98.6°F | Resp 16 | Ht 63.0 in | Wt 162.4 lb

## 2023-05-20 DIAGNOSIS — E119 Type 2 diabetes mellitus without complications: Secondary | ICD-10-CM | POA: Diagnosis not present

## 2023-05-20 DIAGNOSIS — Z01818 Encounter for other preprocedural examination: Secondary | ICD-10-CM

## 2023-05-20 DIAGNOSIS — N183 Chronic kidney disease, stage 3 unspecified: Secondary | ICD-10-CM | POA: Insufficient documentation

## 2023-05-20 DIAGNOSIS — R609 Edema, unspecified: Secondary | ICD-10-CM | POA: Insufficient documentation

## 2023-05-20 DIAGNOSIS — I129 Hypertensive chronic kidney disease with stage 1 through stage 4 chronic kidney disease, or unspecified chronic kidney disease: Secondary | ICD-10-CM | POA: Diagnosis not present

## 2023-05-20 DIAGNOSIS — I872 Venous insufficiency (chronic) (peripheral): Secondary | ICD-10-CM | POA: Insufficient documentation

## 2023-05-20 DIAGNOSIS — Z01812 Encounter for preprocedural laboratory examination: Secondary | ICD-10-CM | POA: Diagnosis present

## 2023-05-20 DIAGNOSIS — K219 Gastro-esophageal reflux disease without esophagitis: Secondary | ICD-10-CM | POA: Insufficient documentation

## 2023-05-20 DIAGNOSIS — E1122 Type 2 diabetes mellitus with diabetic chronic kidney disease: Secondary | ICD-10-CM | POA: Diagnosis not present

## 2023-05-20 DIAGNOSIS — G4733 Obstructive sleep apnea (adult) (pediatric): Secondary | ICD-10-CM | POA: Insufficient documentation

## 2023-05-20 LAB — BASIC METABOLIC PANEL
Anion gap: 12 (ref 5–15)
BUN: 19 mg/dL (ref 8–23)
CO2: 26 mmol/L (ref 22–32)
Calcium: 9.5 mg/dL (ref 8.9–10.3)
Chloride: 100 mmol/L (ref 98–111)
Creatinine, Ser: 1.08 mg/dL — ABNORMAL HIGH (ref 0.44–1.00)
GFR, Estimated: 53 mL/min — ABNORMAL LOW (ref >60)
Glucose, Bld: 111 mg/dL — ABNORMAL HIGH (ref 70–99)
Potassium: 3.4 mmol/L — ABNORMAL LOW (ref 3.5–5.1)
Sodium: 138 mmol/L (ref 135–145)

## 2023-05-20 LAB — TYPE AND SCREEN
ABO/RH(D): A POS
Antibody Screen: NEGATIVE

## 2023-05-20 LAB — HEMOGLOBIN A1C
Hgb A1c MFr Bld: 6.8 % — ABNORMAL HIGH (ref 4.8–5.6)
Mean Plasma Glucose: 148.46 mg/dL

## 2023-05-20 LAB — CBC
HCT: 34.1 % — ABNORMAL LOW (ref 36.0–46.0)
Hemoglobin: 11.1 g/dL — ABNORMAL LOW (ref 12.0–15.0)
MCH: 29.4 pg (ref 26.0–34.0)
MCHC: 32.6 g/dL (ref 30.0–36.0)
MCV: 90.2 fL (ref 80.0–100.0)
Platelets: 331 10*3/uL (ref 150–400)
RBC: 3.78 MIL/uL — ABNORMAL LOW (ref 3.87–5.11)
RDW: 14.5 % (ref 11.5–15.5)
WBC: 8 10*3/uL (ref 4.0–10.5)
nRBC: 0 % (ref 0.0–0.2)

## 2023-05-20 LAB — SURGICAL PCR SCREEN
MRSA, PCR: NEGATIVE
Staphylococcus aureus: NEGATIVE

## 2023-05-20 LAB — GLUCOSE, CAPILLARY: Glucose-Capillary: 99 mg/dL (ref 70–99)

## 2023-05-20 NOTE — Progress Notes (Signed)
PCP - Dr. Doreatha Martin Cardiologist - denies  PPM/ICD - n/a Device Orders - n/a Rep Notified - n/a  Chest x-ray - n/a EKG - 11/12/22. Care Everywhere. EKG tracing requested.  Stress Test - denies ECHO - denies Cardiac Cath - denies  Sleep Study - +OSA. Has not worn CPAP since knee replacement surgery 12/12/22 CPAP - n/a  CBG today= 99.  Checks blood sugar twice a day.  Normal fasting blood sugar range- 100-110 per patient  Last dose of GLP1 agonist-  n/a GLP1 instructions: n/a  Blood Thinner Instructions: n/a Aspirin Instructions: Please follow instructions from your surgeon's office. If you have not received instructions then contact the office.   ERAS Protcol - NPO   COVID TEST- n/a   Anesthesia review: Yes. EKG tracing requested.   Patient denies shortness of breath, fever, cough and chest pain at PAT appointment   All instructions explained to the patient, with a verbal understanding of the material. Patient agrees to go over the instructions while at home for a better understanding. The opportunity to ask questions was provided.

## 2023-05-21 NOTE — Anesthesia Preprocedure Evaluation (Addendum)
Anesthesia Evaluation  Patient identified by MRN, date of birth, ID band Patient awake    Reviewed: Allergy & Precautions, NPO status , Patient's Chart, lab work & pertinent test results  History of Anesthesia Complications (+) PONV, PROLONGED EMERGENCE and history of anesthetic complications  Airway Mallampati: II  TM Distance: >3 FB Neck ROM: Full    Dental  (+) Missing, Dental Advisory Given   Pulmonary sleep apnea and Continuous Positive Airway Pressure Ventilation    breath sounds clear to auscultation       Cardiovascular hypertension, Pt. on medications (-) angina  Rhythm:Regular Rate:Normal  '20 ECHO: EF 65%, no significant valvular abnormalities,  '23 stress: no infarct or ischemia, EF 85%   Neuro/Psych  Headaches  Anxiety Depression       GI/Hepatic Neg liver ROS,GERD  Medicated and Controlled,,  Endo/Other  diabetes (glu 130), Insulin Dependent, Oral Hypoglycemic Agents    Renal/GU Renal InsufficiencyRenal disease     Musculoskeletal   Abdominal   Peds  Hematology  (+) Blood dyscrasia (Hb 11.1), anemia   Anesthesia Other Findings   Reproductive/Obstetrics                             Anesthesia Physical Anesthesia Plan  ASA: 3  Anesthesia Plan: General   Post-op Pain Management: Tylenol PO (pre-op)*   Induction: Intravenous  PONV Risk Score and Plan: 4 or greater and Ondansetron, Dexamethasone, Treatment may vary due to age or medical condition, Diphenhydramine and Propofol infusion  Airway Management Planned: Oral ETT  Additional Equipment: None  Intra-op Plan:   Post-operative Plan: Extubation in OR  Informed Consent: I have reviewed the patients History and Physical, chart, labs and discussed the procedure including the risks, benefits and alternatives for the proposed anesthesia with the patient or authorized representative who has indicated his/her understanding  and acceptance.     Dental advisory given  Plan Discussed with: CRNA and Surgeon  Anesthesia Plan Comments: (PAT note by Antionette Poles, PA-C: 77 year old female with pertinent history including IDDM 2, OSA on CPAP (reports not using since she had knee replacement in February 2024), GERD, stage III CKD, HTN, PONV, venous insufficiency/LE edema.  Chronic medical conditions followed by PCP Dr[facility] Fanny [facility] at [facility].  Last seen 01/13/2023, chronic conditions stable at that time, no acute concerns.  Patient had recent evaluation for atypical chest pain.  Nuclear stress test 08/14/2022 with no evidence of infarct or ischemia.  Prior TTE 11/18/2018 showed EF 65%, no significant valvular abnormalities.  Preop labs reviewed, mild hypokalemia potassium 3.4, mild anemia with hemoglobin 11.1, otherwise unremarkable.  Diabetes well-controlled with A1c 6.8.  Recent EKG 11/12/2022 requested from [facility].  If not received, patient will need day of surgery tracing.  Nuclear stress 08/13/2022 (Care Everywhere): 1. Uneventful Lexiscan injection with no evidence of ischemia during  vasodilator stress.  2. Normal myocardial perfusion imaging with no evidence of infarct or  ischemia  3.Normal left-ventricular systolic function with a calculated ejection  fraction of 85%.    TTE 11/18/2021 (Care Everywhere): SUMMARY  Left ventricular systolic function is normal.  LV ejection fraction = 60-65%.  There is aortic valve sclerosis.  There is no aortic stenosis.  There is trace aortic regurgitation.  There is no comparison study available.    )        Anesthesia Quick Evaluation

## 2023-05-21 NOTE — Progress Notes (Signed)
Anesthesia Chart Review:  77 year old female with pertinent history including IDDM 2, OSA on CPAP (reports not using since she had knee replacement in February 2024), GERD, stage III CKD, HTN, PONV, venous insufficiency/LE edema.  Chronic medical conditions followed by PCP Dr. Fanny Dance at [facility].  Last seen 01/13/2023, chronic conditions stable at that time, no acute concerns.  Patient had recent evaluation for atypical chest pain.  Nuclear stress test 08/14/2022 with no evidence of infarct or ischemia.  Prior TTE 11/18/2018 showed EF 65%, no significant valvular abnormalities.  Preop labs reviewed, mild hypokalemia potassium 3.4, mild anemia with hemoglobin 11.1, otherwise unremarkable.  Diabetes well-controlled with A1c 6.8.  Recent EKG 11/12/2022 requested from [facility].  If not received, patient will need day of surgery tracing.  Nuclear stress 08/13/2022 (Care Everywhere): 1.  Uneventful Lexiscan injection with no evidence of ischemia during  vasodilator stress.  2.  Normal myocardial perfusion imaging with no evidence of infarct or  ischemia  3. Normal left-ventricular systolic function with a calculated ejection  fraction of 85%.    TTE 11/18/2021 (Care Everywhere): SUMMARY  Left ventricular systolic function is normal.  LV ejection fraction = 60-65%.  There is aortic valve sclerosis.  There is no aortic stenosis.  There is trace aortic regurgitation.  There is no comparison study available.     Zannie Cove [facility] Short Stay Center/Anesthesiology Phone [phone] 05/21/2023 4:08 PM

## 2023-05-31 ENCOUNTER — Other Ambulatory Visit: Payer: Self-pay

## 2023-05-31 ENCOUNTER — Inpatient Hospital Stay (HOSPITAL_COMMUNITY)
Admission: RE | Admit: 2023-05-31 | Discharge: 2023-06-01 | Disposition: A | Discharge status: Home / Self Care | Payer: Medicare Other | Attending: Neurosurgery | Admitting: Neurosurgery

## 2023-05-31 ENCOUNTER — Encounter (HOSPITAL_COMMUNITY)
Admission: RE | Disposition: A | Discharge status: Home / Self Care | Payer: Self-pay | Source: Home / Self Care | Attending: Neurosurgery

## 2023-05-31 ENCOUNTER — Inpatient Hospital Stay (HOSPITAL_COMMUNITY): Payer: Medicare Other | Admitting: Physician Assistant

## 2023-05-31 ENCOUNTER — Inpatient Hospital Stay (HOSPITAL_COMMUNITY): Payer: Medicare Other

## 2023-05-31 ENCOUNTER — Encounter (HOSPITAL_COMMUNITY): Payer: Self-pay | Admitting: Neurosurgery

## 2023-05-31 DIAGNOSIS — Z794 Long term (current) use of insulin: Secondary | ICD-10-CM

## 2023-05-31 DIAGNOSIS — Z981 Arthrodesis status: Secondary | ICD-10-CM | POA: Diagnosis not present

## 2023-05-31 DIAGNOSIS — Z79899 Other long term (current) drug therapy: Secondary | ICD-10-CM

## 2023-05-31 DIAGNOSIS — F419 Anxiety disorder, unspecified: Secondary | ICD-10-CM | POA: Diagnosis present

## 2023-05-31 DIAGNOSIS — K219 Gastro-esophageal reflux disease without esophagitis: Secondary | ICD-10-CM | POA: Diagnosis present

## 2023-05-31 DIAGNOSIS — F418 Other specified anxiety disorders: Secondary | ICD-10-CM

## 2023-05-31 DIAGNOSIS — M25551 Pain in right hip: Secondary | ICD-10-CM | POA: Diagnosis present

## 2023-05-31 DIAGNOSIS — M25552 Pain in left hip: Secondary | ICD-10-CM | POA: Diagnosis present

## 2023-05-31 DIAGNOSIS — M5134 Other intervertebral disc degeneration, thoracic region: Secondary | ICD-10-CM | POA: Diagnosis not present

## 2023-05-31 DIAGNOSIS — E785 Hyperlipidemia, unspecified: Secondary | ICD-10-CM | POA: Diagnosis present

## 2023-05-31 DIAGNOSIS — Z90710 Acquired absence of both cervix and uterus: Secondary | ICD-10-CM

## 2023-05-31 DIAGNOSIS — F32A Depression, unspecified: Secondary | ICD-10-CM | POA: Diagnosis present

## 2023-05-31 DIAGNOSIS — E119 Type 2 diabetes mellitus without complications: Secondary | ICD-10-CM

## 2023-05-31 DIAGNOSIS — Z87442 Personal history of urinary calculi: Secondary | ICD-10-CM

## 2023-05-31 DIAGNOSIS — N183 Chronic kidney disease, stage 3 unspecified: Secondary | ICD-10-CM | POA: Diagnosis present

## 2023-05-31 DIAGNOSIS — G4730 Sleep apnea, unspecified: Secondary | ICD-10-CM | POA: Diagnosis present

## 2023-05-31 DIAGNOSIS — Z7982 Long term (current) use of aspirin: Secondary | ICD-10-CM | POA: Diagnosis not present

## 2023-05-31 DIAGNOSIS — Z885 Allergy status to narcotic agent status: Secondary | ICD-10-CM

## 2023-05-31 DIAGNOSIS — Z7984 Long term (current) use of oral hypoglycemic drugs: Secondary | ICD-10-CM

## 2023-05-31 DIAGNOSIS — G2581 Restless legs syndrome: Secondary | ICD-10-CM | POA: Diagnosis present

## 2023-05-31 DIAGNOSIS — E1122 Type 2 diabetes mellitus with diabetic chronic kidney disease: Secondary | ICD-10-CM | POA: Diagnosis present

## 2023-05-31 DIAGNOSIS — Z888 Allergy status to other drugs, medicaments and biological substances status: Secondary | ICD-10-CM | POA: Diagnosis not present

## 2023-05-31 DIAGNOSIS — M5136 Other intervertebral disc degeneration, lumbar region: Secondary | ICD-10-CM | POA: Diagnosis present

## 2023-05-31 DIAGNOSIS — I129 Hypertensive chronic kidney disease with stage 1 through stage 4 chronic kidney disease, or unspecified chronic kidney disease: Secondary | ICD-10-CM | POA: Diagnosis present

## 2023-05-31 DIAGNOSIS — I10 Essential (primary) hypertension: Secondary | ICD-10-CM | POA: Diagnosis not present

## 2023-05-31 DIAGNOSIS — Z96652 Presence of left artificial knee joint: Secondary | ICD-10-CM | POA: Diagnosis present

## 2023-05-31 DIAGNOSIS — M5135 Other intervertebral disc degeneration, thoracolumbar region: Principal | ICD-10-CM | POA: Diagnosis present

## 2023-05-31 HISTORY — PX: LAMINECTOMY WITH POSTERIOR LATERAL ARTHRODESIS LEVEL 3: SHX6337

## 2023-05-31 LAB — GLUCOSE, CAPILLARY
Glucose-Capillary: 130 mg/dL — ABNORMAL HIGH (ref 70–99)
Glucose-Capillary: 138 mg/dL — ABNORMAL HIGH (ref 70–99)
Glucose-Capillary: 233 mg/dL — ABNORMAL HIGH (ref 70–99)
Glucose-Capillary: 237 mg/dL — ABNORMAL HIGH (ref 70–99)

## 2023-05-31 SURGERY — LAMINECTOMY WITH POSTERIOR LATERAL ARTHRODESIS LEVEL 3
Anesthesia: General | Site: Back

## 2023-05-31 MED ORDER — LACTATED RINGERS IV SOLN: INTRAVENOUS | Status: DC

## 2023-05-31 MED ORDER — METFORMIN HCL ER 500 MG PO TB24
500.0000 mg | ORAL_TABLET | Freq: Two times a day (BID) | ORAL | Status: DC
  Administered 2023-06-01: 500 mg via ORAL
  Filled 2023-05-31: qty 1

## 2023-05-31 MED ORDER — GABAPENTIN 300 MG PO CAPS
600.0000 mg | ORAL_CAPSULE | Freq: Every day | ORAL | Status: DC
  Administered 2023-05-31: 600 mg via ORAL
  Filled 2023-05-31: qty 2

## 2023-05-31 MED ORDER — THROMBIN 20000 UNITS EX SOLR
CUTANEOUS | Status: DC | PRN
  Administered 2023-05-31: 20 mL via TOPICAL

## 2023-05-31 MED ORDER — HYDROMORPHONE HCL 1 MG/ML IJ SOLN
INTRAMUSCULAR | Status: AC
  Filled 2023-05-31: qty 1

## 2023-05-31 MED ORDER — ONDANSETRON HCL 4 MG PO TABS: 4.0000 mg | ORAL_TABLET | Freq: Four times a day (QID) | ORAL | Status: DC | PRN

## 2023-05-31 MED ORDER — CEFAZOLIN SODIUM-DEXTROSE 2-4 GM/100ML-% IV SOLN
2.0000 g | INTRAVENOUS | Status: AC
  Administered 2023-05-31: 2 g via INTRAVENOUS
  Filled 2023-05-31: qty 100

## 2023-05-31 MED ORDER — ALUM & MAG HYDROXIDE-SIMETH 200-200-20 MG/5ML PO SUSP: 30.0000 mL | Freq: Four times a day (QID) | ORAL | Status: DC | PRN

## 2023-05-31 MED ORDER — DEXAMETHASONE SODIUM PHOSPHATE 10 MG/ML IJ SOLN
INTRAMUSCULAR | Status: AC
  Filled 2023-05-31: qty 1

## 2023-05-31 MED ORDER — ASPIRIN 81 MG PO TBEC: 81.0000 mg | DELAYED_RELEASE_TABLET | Freq: Every day | ORAL | Status: DC

## 2023-05-31 MED ORDER — BUTALBITAL-APAP-CAFFEINE 50-325-40 MG PO TABS: 1.0000 | ORAL_TABLET | Freq: Four times a day (QID) | ORAL | Status: DC | PRN

## 2023-05-31 MED ORDER — CEFAZOLIN SODIUM-DEXTROSE 2-4 GM/100ML-% IV SOLN
2.0000 g | Freq: Three times a day (TID) | INTRAVENOUS | Status: DC
  Administered 2023-05-31 (×2): 2 g via INTRAVENOUS
  Filled 2023-05-31 (×2): qty 100

## 2023-05-31 MED ORDER — PROPOFOL 10 MG/ML IV BOLUS
INTRAVENOUS | Status: AC
  Filled 2023-05-31: qty 20

## 2023-05-31 MED ORDER — FLUTICASONE PROPIONATE 50 MCG/ACT NA SUSP: 1.0000 | Freq: Every day | NASAL | Status: DC | PRN

## 2023-05-31 MED ORDER — OXYCODONE HCL 5 MG PO TABS
5.0000 mg | ORAL_TABLET | Freq: Four times a day (QID) | ORAL | Status: DC | PRN
  Administered 2023-05-31: 5 mg via ORAL
  Filled 2023-05-31: qty 1

## 2023-05-31 MED ORDER — HYDROMORPHONE HCL 1 MG/ML IJ SOLN
0.2500 mg | INTRAMUSCULAR | Status: DC | PRN
  Administered 2023-05-31 (×4): 0.5 mg via INTRAVENOUS

## 2023-05-31 MED ORDER — BUPIVACAINE LIPOSOME 1.3 % IJ SUSP
INTRAMUSCULAR | Status: AC
  Filled 2023-05-31: qty 20

## 2023-05-31 MED ORDER — DEXAMETHASONE SODIUM PHOSPHATE 10 MG/ML IJ SOLN
INTRAMUSCULAR | Status: DC | PRN
  Administered 2023-05-31: 5 mg via INTRAVENOUS

## 2023-05-31 MED ORDER — OXYCODONE HCL 5 MG/5ML PO SOLN: 5.0000 mg | Freq: Once | ORAL | Status: DC | PRN

## 2023-05-31 MED ORDER — PHENOL 1.4 % MT LIQD: 1.0000 | OROMUCOSAL | Status: DC | PRN

## 2023-05-31 MED ORDER — PRAVASTATIN SODIUM 10 MG PO TABS: 10.0000 mg | ORAL_TABLET | Freq: Every day | ORAL | Status: DC

## 2023-05-31 MED ORDER — LIDOCAINE-EPINEPHRINE 1 %-1:100000 IJ SOLN
INTRAMUSCULAR | Status: AC
  Filled 2023-05-31: qty 1

## 2023-05-31 MED ORDER — ONDANSETRON HCL 4 MG/2ML IJ SOLN
INTRAMUSCULAR | Status: AC
  Filled 2023-05-31: qty 2

## 2023-05-31 MED ORDER — HYDROMORPHONE HCL 1 MG/ML IJ SOLN
0.5000 mg | INTRAMUSCULAR | Status: DC | PRN
  Administered 2023-06-01: 0.5 mg via INTRAVENOUS
  Filled 2023-05-31: qty 0.5

## 2023-05-31 MED ORDER — PANTOPRAZOLE SODIUM 40 MG IV SOLR: 40.0000 mg | Freq: Every day | INTRAVENOUS | Status: DC

## 2023-05-31 MED ORDER — ACETAMINOPHEN 325 MG PO TABS
650.0000 mg | ORAL_TABLET | ORAL | Status: DC | PRN
  Administered 2023-05-31: 650 mg via ORAL
  Filled 2023-05-31: qty 2

## 2023-05-31 MED ORDER — PRAMIPEXOLE DIHYDROCHLORIDE 0.25 MG PO TABS
1.0000 mg | ORAL_TABLET | Freq: Every day | ORAL | Status: DC
  Administered 2023-05-31: 1 mg via ORAL
  Filled 2023-05-31: qty 4

## 2023-05-31 MED ORDER — OXYCODONE-ACETAMINOPHEN 10-325 MG PO TABS: 1.0000 | ORAL_TABLET | Freq: Four times a day (QID) | ORAL | Status: DC | PRN

## 2023-05-31 MED ORDER — SODIUM CHLORIDE 0.9% FLUSH: 3.0000 mL | INTRAVENOUS | Status: DC | PRN

## 2023-05-31 MED ORDER — TIZANIDINE HCL 4 MG PO TABS: 2.0000 mg | ORAL_TABLET | Freq: Three times a day (TID) | ORAL | Status: DC

## 2023-05-31 MED ORDER — ACETAMINOPHEN 650 MG RE SUPP: 650.0000 mg | RECTAL | Status: DC | PRN

## 2023-05-31 MED ORDER — DIPHENHYDRAMINE HCL 50 MG/ML IJ SOLN
INTRAMUSCULAR | Status: DC | PRN
  Administered 2023-05-31: 12.5 mg via INTRAVENOUS

## 2023-05-31 MED ORDER — MECLIZINE HCL 25 MG PO TABS: 25.0000 mg | ORAL_TABLET | Freq: Four times a day (QID) | ORAL | Status: DC | PRN

## 2023-05-31 MED ORDER — BUSPIRONE HCL 10 MG PO TABS
10.0000 mg | ORAL_TABLET | Freq: Two times a day (BID) | ORAL | Status: DC
  Administered 2023-05-31: 10 mg via ORAL
  Filled 2023-05-31: qty 1

## 2023-05-31 MED ORDER — MEPERIDINE HCL 25 MG/ML IJ SOLN: 6.2500 mg | INTRAMUSCULAR | Status: DC | PRN

## 2023-05-31 MED ORDER — PHENYLEPHRINE HCL-NACL 20-0.9 MG/250ML-% IV SOLN
INTRAVENOUS | Status: DC | PRN
  Administered 2023-05-31: 20 ug/min via INTRAVENOUS

## 2023-05-31 MED ORDER — CLOTRIMAZOLE 1 % EX CREA
TOPICAL_CREAM | Freq: Two times a day (BID) | CUTANEOUS | Status: DC
  Filled 2023-05-31: qty 15

## 2023-05-31 MED ORDER — OXYCODONE HCL 5 MG PO TABS: 5.0000 mg | ORAL_TABLET | Freq: Once | ORAL | Status: DC | PRN

## 2023-05-31 MED ORDER — SUVOREXANT 15 MG PO TABS: 15.0000 mg | ORAL_TABLET | Freq: Every evening | ORAL | Status: DC | PRN

## 2023-05-31 MED ORDER — FENTANYL CITRATE (PF) 250 MCG/5ML IJ SOLN
INTRAMUSCULAR | Status: DC | PRN
  Administered 2023-05-31: 150 ug via INTRAVENOUS
  Administered 2023-05-31: 25 ug via INTRAVENOUS

## 2023-05-31 MED ORDER — HYDROCHLOROTHIAZIDE 25 MG PO TABS
25.0000 mg | ORAL_TABLET | Freq: Every day | ORAL | Status: DC
  Administered 2023-05-31: 25 mg via ORAL
  Filled 2023-05-31: qty 1

## 2023-05-31 MED ORDER — LACTATED RINGERS IV SOLN: INTRAVENOUS | Status: DC | PRN

## 2023-05-31 MED ORDER — LIDOCAINE-EPINEPHRINE 1 %-1:100000 IJ SOLN
INTRAMUSCULAR | Status: DC | PRN
  Administered 2023-05-31: 10 mL

## 2023-05-31 MED ORDER — THROMBIN 5000 UNITS EX SOLR
OROMUCOSAL | Status: DC | PRN
  Administered 2023-05-31: 5 mL via TOPICAL

## 2023-05-31 MED ORDER — ONDANSETRON HCL 4 MG/2ML IJ SOLN
INTRAMUSCULAR | Status: DC | PRN
  Administered 2023-05-31: 4 mg via INTRAVENOUS

## 2023-05-31 MED ORDER — LIDOCAINE 2% (20 MG/ML) 5 ML SYRINGE
INTRAMUSCULAR | Status: DC | PRN
  Administered 2023-05-31: 30 mg via INTRAVENOUS

## 2023-05-31 MED ORDER — ROCURONIUM BROMIDE 10 MG/ML (PF) SYRINGE
PREFILLED_SYRINGE | INTRAVENOUS | Status: DC | PRN
  Administered 2023-05-31: 60 mg via INTRAVENOUS
  Administered 2023-05-31: 10 mg via INTRAVENOUS

## 2023-05-31 MED ORDER — PANTOPRAZOLE SODIUM 40 MG PO TBEC: 40.0000 mg | DELAYED_RELEASE_TABLET | Freq: Every day | ORAL | Status: DC

## 2023-05-31 MED ORDER — INSULIN ASPART 100 UNIT/ML IJ SOLN: 0.0000 [IU] | Freq: Three times a day (TID) | INTRAMUSCULAR | Status: DC

## 2023-05-31 MED ORDER — MIDAZOLAM HCL 2 MG/2ML IJ SOLN: 0.5000 mg | Freq: Once | INTRAMUSCULAR | Status: DC | PRN

## 2023-05-31 MED ORDER — PROMETHAZINE HCL 25 MG/ML IJ SOLN: 6.2500 mg | INTRAMUSCULAR | Status: DC | PRN

## 2023-05-31 MED ORDER — CYCLOBENZAPRINE HCL 10 MG PO TABS
10.0000 mg | ORAL_TABLET | Freq: Three times a day (TID) | ORAL | Status: DC | PRN
  Administered 2023-05-31 – 2023-06-01 (×2): 10 mg via ORAL
  Filled 2023-05-31 (×2): qty 1

## 2023-05-31 MED ORDER — BUPIVACAINE HCL (PF) 0.25 % IJ SOLN
INTRAMUSCULAR | Status: AC
  Filled 2023-05-31: qty 30

## 2023-05-31 MED ORDER — ROCURONIUM BROMIDE 10 MG/ML (PF) SYRINGE
PREFILLED_SYRINGE | INTRAVENOUS | Status: AC
  Filled 2023-05-31: qty 10

## 2023-05-31 MED ORDER — OXYCODONE HCL 5 MG PO TABS
10.0000 mg | ORAL_TABLET | ORAL | Status: DC | PRN
  Administered 2023-05-31 (×2): 10 mg via ORAL
  Administered 2023-06-01: 5 mg via ORAL
  Filled 2023-05-31 (×3): qty 2

## 2023-05-31 MED ORDER — FENTANYL CITRATE (PF) 250 MCG/5ML IJ SOLN
INTRAMUSCULAR | Status: AC
  Filled 2023-05-31: qty 5

## 2023-05-31 MED ORDER — SODIUM CHLORIDE 0.9 % IV SOLN
250.0000 mL | INTRAVENOUS | Status: DC
  Administered 2023-05-31: 250 mL via INTRAVENOUS

## 2023-05-31 MED ORDER — ONDANSETRON HCL 4 MG/2ML IJ SOLN: 4.0000 mg | Freq: Four times a day (QID) | INTRAMUSCULAR | Status: DC | PRN

## 2023-05-31 MED ORDER — ORAL CARE MOUTH RINSE: 15.0000 mL | Freq: Once | OROMUCOSAL | Status: AC

## 2023-05-31 MED ORDER — CHLORHEXIDINE GLUCONATE CLOTH 2 % EX PADS: 6.0000 | MEDICATED_PAD | Freq: Once | CUTANEOUS | Status: DC

## 2023-05-31 MED ORDER — METFORMIN HCL ER 500 MG PO TB24
500.0000 mg | ORAL_TABLET | Freq: Two times a day (BID) | ORAL | Status: DC
  Administered 2023-05-31: 500 mg via ORAL
  Filled 2023-05-31: qty 1

## 2023-05-31 MED ORDER — IRBESARTAN 75 MG PO TABS
37.5000 mg | ORAL_TABLET | Freq: Every day | ORAL | Status: DC
  Administered 2023-05-31: 37.5 mg via ORAL
  Filled 2023-05-31: qty 1

## 2023-05-31 MED ORDER — GABAPENTIN 300 MG PO CAPS
300.0000 mg | ORAL_CAPSULE | Freq: Two times a day (BID) | ORAL | Status: DC
  Administered 2023-05-31: 300 mg via ORAL
  Filled 2023-05-31 (×2): qty 1

## 2023-05-31 MED ORDER — THROMBIN 5000 UNITS EX SOLR
CUTANEOUS | Status: AC
  Filled 2023-05-31: qty 5000

## 2023-05-31 MED ORDER — LIDOCAINE 2% (20 MG/ML) 5 ML SYRINGE
INTRAMUSCULAR | Status: AC
  Filled 2023-05-31: qty 5

## 2023-05-31 MED ORDER — THROMBIN 20000 UNITS EX SOLR
CUTANEOUS | Status: AC
  Filled 2023-05-31: qty 20000

## 2023-05-31 MED ORDER — EPHEDRINE SULFATE-NACL 50-0.9 MG/10ML-% IV SOSY
PREFILLED_SYRINGE | INTRAVENOUS | Status: DC | PRN
  Administered 2023-05-31: 5 mg via INTRAVENOUS

## 2023-05-31 MED ORDER — BUPIVACAINE LIPOSOME 1.3 % IJ SUSP
INTRAMUSCULAR | Status: DC | PRN
  Administered 2023-05-31: 20 mL

## 2023-05-31 MED ORDER — CHLORHEXIDINE GLUCONATE 0.12 % MT SOLN
15.0000 mL | Freq: Once | OROMUCOSAL | Status: AC
  Administered 2023-05-31: 15 mL via OROMUCOSAL
  Filled 2023-05-31: qty 15

## 2023-05-31 MED ORDER — PHENYLEPHRINE 80 MCG/ML (10ML) SYRINGE FOR IV PUSH (FOR BLOOD PRESSURE SUPPORT)
PREFILLED_SYRINGE | INTRAVENOUS | Status: AC
  Filled 2023-05-31: qty 10

## 2023-05-31 MED ORDER — OXYCODONE-ACETAMINOPHEN 5-325 MG PO TABS
1.0000 | ORAL_TABLET | Freq: Four times a day (QID) | ORAL | Status: DC | PRN
  Administered 2023-05-31: 1 via ORAL
  Filled 2023-05-31: qty 1

## 2023-05-31 MED ORDER — ACETAMINOPHEN 500 MG PO TABS
1000.0000 mg | ORAL_TABLET | Freq: Once | ORAL | Status: AC
  Administered 2023-05-31: 1000 mg via ORAL
  Filled 2023-05-31: qty 2

## 2023-05-31 MED ORDER — VENLAFAXINE HCL ER 37.5 MG PO CP24
37.5000 mg | ORAL_CAPSULE | Freq: Every day | ORAL | Status: DC
  Filled 2023-05-31: qty 1

## 2023-05-31 MED ORDER — SODIUM CHLORIDE 0.9% FLUSH
3.0000 mL | Freq: Two times a day (BID) | INTRAVENOUS | Status: DC
  Administered 2023-05-31: 3 mL via INTRAVENOUS

## 2023-05-31 MED ORDER — SUGAMMADEX SODIUM 200 MG/2ML IV SOLN
INTRAVENOUS | Status: DC | PRN
  Administered 2023-05-31: 300 mg via INTRAVENOUS

## 2023-05-31 MED ORDER — MENTHOL 3 MG MT LOZG
1.0000 | LOZENGE | OROMUCOSAL | Status: DC | PRN
  Filled 2023-05-31: qty 9

## 2023-05-31 MED ORDER — PROPOFOL 10 MG/ML IV BOLUS
INTRAVENOUS | Status: DC | PRN
Start: 2023-05-31 — End: 2023-05-31
  Administered 2023-05-31: 80 mg via INTRAVENOUS
  Administered 2023-05-31: 35 ug/kg/min via INTRAVENOUS

## 2023-05-31 MED ORDER — POTASSIUM CHLORIDE CRYS ER 20 MEQ PO TBCR
20.0000 meq | EXTENDED_RELEASE_TABLET | Freq: Two times a day (BID) | ORAL | Status: DC
  Administered 2023-05-31 (×2): 20 meq via ORAL
  Filled 2023-05-31 (×2): qty 1

## 2023-05-31 SURGICAL SUPPLY — 76 items
ADH SKN CLS APL DERMABOND .7 (GAUZE/BANDAGES/DRESSINGS) ×1
APL SKNCLS STERI-STRIP NONHPOA (GAUZE/BANDAGES/DRESSINGS) ×1
BAG COUNTER SPONGE SURGICOUNT (BAG) ×1 IMPLANT
BAG SPNG CNTER NS LX DISP (BAG) ×1
BENZOIN TINCTURE PRP APPL 2/3 (GAUZE/BANDAGES/DRESSINGS) ×1 IMPLANT
BLADE CLIPPER SURG (BLADE) IMPLANT
BLADE SURG 11 STRL SS (BLADE) ×1 IMPLANT
BONE CANC CHIPS 20CC PCAN1/4 (Bone Implant) ×1 IMPLANT
BONE VIVIGEN FORMABLE 10CC (Bone Implant) ×1 IMPLANT
BUR MATCHSTICK NEURO 3.0 LAGG (BURR) ×1 IMPLANT
BUR PRECISION FLUTE 6.0 (BURR) ×1 IMPLANT
CANISTER SUCT 3000ML PPV (MISCELLANEOUS) ×1 IMPLANT
CAP LOCKING THREADED (Cap) IMPLANT
CHIPS CANC BONE 20CC PCAN1/4 (Bone Implant) ×1 IMPLANT
CLAMP REVERE ADDITION 5.5-5.5 (Clamp) IMPLANT
CLAMP ROD TO ROD 5.5-5.5 (Clamp) IMPLANT
CNTNR URN SCR LID CUP LEK RST (MISCELLANEOUS) ×1 IMPLANT
CONT SPEC 4OZ STRL OR WHT (MISCELLANEOUS) ×1
COVER BACK TABLE 24X17X13 BIG (DRAPES) IMPLANT
COVER BACK TABLE 60X90IN (DRAPES) ×1 IMPLANT
DERMABOND ADVANCED .7 DNX12 (GAUZE/BANDAGES/DRESSINGS) ×1 IMPLANT
DRAPE C-ARM 42X72 X-RAY (DRAPES) ×2 IMPLANT
DRAPE HALF SHEET 40X57 (DRAPES) IMPLANT
DRAPE LAPAROTOMY 100X72X124 (DRAPES) ×1 IMPLANT
DRAPE POUCH INSTRU U-SHP 10X18 (DRAPES) ×1 IMPLANT
DRAPE SURG 17X23 STRL (DRAPES) ×1 IMPLANT
DRSG OPSITE 4X5.5 SM (GAUZE/BANDAGES/DRESSINGS) IMPLANT
DRSG OPSITE POSTOP 4X10 (GAUZE/BANDAGES/DRESSINGS) IMPLANT
DURAPREP 26ML APPLICATOR (WOUND CARE) ×1 IMPLANT
ELECT REM PT RETURN 9FT ADLT (ELECTROSURGICAL) ×1
ELECTRODE REM PT RTRN 9FT ADLT (ELECTROSURGICAL) ×1 IMPLANT
EVACUATOR 3/16 PVC DRAIN (DRAIN) ×1 IMPLANT
GAUZE 4X4 16PLY ~~LOC~~+RFID DBL (SPONGE) IMPLANT
GAUZE SPONGE 4X4 12PLY STRL (GAUZE/BANDAGES/DRESSINGS) ×1 IMPLANT
GLOVE BIO SURGEON STRL SZ7 (GLOVE) IMPLANT
GLOVE BIO SURGEON STRL SZ7.5 (GLOVE) IMPLANT
GLOVE BIO SURGEON STRL SZ8 (GLOVE) ×2 IMPLANT
GLOVE BIOGEL PI IND STRL 7.0 (GLOVE) IMPLANT
GLOVE BIOGEL PI IND STRL 7.5 (GLOVE) IMPLANT
GLOVE BIOGEL PI IND STRL 8 (GLOVE) IMPLANT
GLOVE ECLIPSE 7.5 STRL STRAW (GLOVE) IMPLANT
GLOVE EXAM NITRILE XL STR (GLOVE) IMPLANT
GLOVE INDICATOR 8.5 STRL (GLOVE) ×4 IMPLANT
GLOVE SS BIOGEL STRL SZ 7 (GLOVE) IMPLANT
GOWN STRL REUS W/ TWL LRG LVL3 (GOWN DISPOSABLE) IMPLANT
GOWN STRL REUS W/ TWL XL LVL3 (GOWN DISPOSABLE) ×2 IMPLANT
GOWN STRL REUS W/TWL 2XL LVL3 (GOWN DISPOSABLE) IMPLANT
GOWN STRL REUS W/TWL LRG LVL3 (GOWN DISPOSABLE)
GOWN STRL REUS W/TWL XL LVL3 (GOWN DISPOSABLE) ×2
GRAFT BNE CANC CHIPS 1-8 20CC (Bone Implant) IMPLANT
GRAFT BNE MATRIX VG FRMBL L 10 (Bone Implant) IMPLANT
KIT BASIN OR (CUSTOM PROCEDURE TRAY) ×1 IMPLANT
KIT INFUSE SMALL (Orthopedic Implant) IMPLANT
KIT TURNOVER KIT B (KITS) ×1 IMPLANT
NDL HYPO 25X1 1.5 SAFETY (NEEDLE) ×1 IMPLANT
NEEDLE HYPO 25X1 1.5 SAFETY (NEEDLE) ×1 IMPLANT
NS IRRIG 1000ML POUR BTL (IV SOLUTION) ×1 IMPLANT
PACK LAMINECTOMY NEURO (CUSTOM PROCEDURE TRAY) ×1 IMPLANT
PAD ARMBOARD 7.5X6 YLW CONV (MISCELLANEOUS) ×3 IMPLANT
ROD Z SPINAL 5.5 12 OFFSET (Rod) IMPLANT
SCREW CREO 5.5X40 (Screw) IMPLANT
SCREW CREO AMP 5.5X35 (Screw) IMPLANT
SCREW PA THRD CREO TULIP 5.5X4 (Head) IMPLANT
SPIKE FLUID TRANSFER (MISCELLANEOUS) ×1 IMPLANT
SPONGE SURGIFOAM ABS GEL 100 (HEMOSTASIS) ×1 IMPLANT
SPONGE T-LAP 4X18 ~~LOC~~+RFID (SPONGE) IMPLANT
STRIP CLOSURE SKIN 1/2X4 (GAUZE/BANDAGES/DRESSINGS) ×2 IMPLANT
SUT VIC AB 0 CT1 18XCR BRD8 (SUTURE) ×2 IMPLANT
SUT VIC AB 0 CT1 8-18 (SUTURE) ×2
SUT VIC AB 2-0 CT1 18 (SUTURE) ×1 IMPLANT
SUT VICRYL 4-0 PS2 18IN ABS (SUTURE) ×1 IMPLANT
SYR CONTROL 10ML LL (SYRINGE) IMPLANT
TOWEL GREEN STERILE (TOWEL DISPOSABLE) ×1 IMPLANT
TOWEL GREEN STERILE FF (TOWEL DISPOSABLE) ×1 IMPLANT
TRAY FOLEY MTR SLVR 16FR STAT (SET/KITS/TRAYS/PACK) ×1 IMPLANT
WATER STERILE IRR 1000ML POUR (IV SOLUTION) ×1 IMPLANT

## 2023-05-31 NOTE — Op Note (Signed)
Preoperative diagnosis: Degenerative disease T12-L1 instability.  Postoperative diagnosis: Same.  Procedure: #1 posterior segmental fixation T10-L1 utilizing the globus Creo amp modular pedicle screw set and the globus addition set with new screws placed at T10, T11, T12 and tying into her old construct at L1-L2.  2.  Posterior lateral arthrodesis T10-11 T11-12 T12-L1 utilizing locally harvested autograft mixed with Vivigen cancellous bone chips and BMP.  Surgeon: Donalee Citrin.  Assistant: Julien Girt.  Anesthesia: General.  EBL: Minimal  HPI: 77 year old female longstanding issues with the back previously undergone L1 S1 fusion presented with progressive worsening back pain workup revealed significant degenerative changes and erosive changes at T12-L1.  Due to the patient's progression of clinical syndrome imaging findings of a conservative treatment I recommended extension of her fusion up to T10.  I extensively reviewed the risks and benefits of the operation with her as well as perioperative course expectations of outcome and alternatives to surgery and she understood and agreed to proceed forward.  Operative procedure: Patient was brought into the OR was induced under general anesthesia positioned prone on the Wilson frame her back was prepped and draped in routine sterile fashion.  Her old incision was identified and the superior aspect of that was opened up and extended cephalad.  Subperiosteal dissection was carried lamina of T9 T10-T11-T12 and exposed her construct down to below the L2 pedicle.  Then utilizing AP and lateral fluoroscopy pilot holes were drilled pedicles were cannulated tapped with a 4 5 tap and 5 5 screws were placed with five 5 x 40's at T10-T11 five 5 x 35's at T12.  All screws had excellent purchase.  I then exposed the rod between the L1 and L2 screw and utilizing single arm connectors NZ rods I connected assembled the heads of the [place] and connected the rods in  place and anchored and torqued everything down.  Then I aggressively decorticated the spinal laminar complex facet joints at T10-11, T11-12, T12-L1 and laid the autograft mix along with BMP Vivigen and cancellous chips down there.  Then placed a medium Hemovac drain meticulous hemostasis was maintained Exparel was injected in the fascia and the wound was closed in layers with active Vicryl and a running 4 subcuticular.  Dermabond benzoin Steri-Strips and a sterile dressing was applied patient recovery in stable condition.  At the end the case all needle count sponge counts were correct.

## 2023-05-31 NOTE — Anesthesia Procedure Notes (Signed)
Procedure Name: Intubation Date/Time: 05/31/2023 7:44 AM  Performed by: Owens Loffler, RNPre-anesthesia Checklist: Patient identified, Emergency Drugs available, Suction available, Patient being monitored and Timeout performed Patient Re-evaluated:Patient Re-evaluated prior to induction Oxygen Delivery Method: Circle system utilized Preoxygenation: Pre-oxygenation with 100% oxygen Induction Type: IV induction Ventilation: Oral airway inserted - appropriate to patient size Laryngoscope Size: Mac and 3 Grade View: Grade I Tube type: Oral Tube size: 7.0 mm Number of attempts: 1 Airway Equipment and Method: Stylet Placement Confirmation: ETT inserted through vocal cords under direct vision, positive ETCO2, CO2 detector and breath sounds checked- equal and bilateral Secured at: 21 cm Tube secured with: Tape Dental Injury: Teeth and Oropharynx as per pre-operative assessment

## 2023-05-31 NOTE — Anesthesia Postprocedure Evaluation (Signed)
Anesthesia Post Note  Patient: Danae P Rounsaville  Procedure(s) Performed: Exploration and extension of fusion - Thoracic ten-Thoracic eleven- Thoracic eleven-Thoracic twelve - Thoracic twelve-Lumbar one (Back)     Patient location during evaluation: PACU Anesthesia Type: General Level of consciousness: awake and alert, patient cooperative and oriented Pain management: pain level controlled Vital Signs Assessment: post-procedure vital signs reviewed and stable Respiratory status: spontaneous breathing, nonlabored ventilation and respiratory function stable Cardiovascular status: blood pressure returned to baseline and stable Postop Assessment: no apparent nausea or vomiting Anesthetic complications: no   There were no known notable events for this encounter.  Last Vitals:  Vitals:   05/31/23 1145 05/31/23 1200  BP: 139/76 139/88  Pulse: 80 82  Resp: 13 16  Temp:    SpO2: 96% 97%    Last Pain:  Vitals:   05/31/23 1145  TempSrc:   PainSc: 6                  ,E. 

## 2023-05-31 NOTE — H&P (Signed)
Tammy Hicks is an 77 y.o. female.   Chief Complaint: Back and bilateral hip pain HPI: 77 year old female presents with progressive worsening back and bilateral hip and leg pain workup is revealed segmental degeneration above her fusion with significant degenerative disc disease erosive changes in the endplates and segmental degeneration.  Due to the patient's progression of clinical syndrome imaging findings of a conservative treatment I recommended extension of her fusion up to T10.  I have extensively gone over the risks and benefits of the operation with her as well as perioperative course expectations of outcome and alternatives of surgery and she understood and agreed to proceed forward.  Past Medical History:  Diagnosis Date   Anxiety    Chronic kidney disease    Stage 3 CKD   Depression    Diabetes mellitus without complication (HCC)    Family history of adverse reaction to anesthesia    "my sister gets really sick afterwards"   GERD (gastroesophageal reflux disease)    Headache    migraines   History of kidney stones    Hyperlipidemia    Hypertension    Insomnia    Osteoarthritis    PONV (postoperative nausea and vomiting)    "progresses to migraine"   Restless leg syndrome    Sleep apnea    uses CPAP    Past Surgical History:  Procedure Laterality Date   ABDOMINAL EXPOSURE N/A 05/05/2021   Procedure: ABDOMINAL EXPOSURE;  Surgeon: Cephus Shelling, MD;  Location: [facility] OR;  Service: Vascular;  Laterality: N/A;   ABDOMINAL HYSTERECTOMY     ANTERIOR LAT LUMBAR FUSION Left 05/05/2021   Procedure: LUMBAR ONE-TWO ANTERIOR LATERAL LUMBAR INTERBODY FUSION;  Surgeon: Donalee Citrin, MD;  Location: [facility] OR;  Service: Neurosurgery;  Laterality: Left;   ANTERIOR LUMBAR FUSION N/A 05/05/2021   Procedure: LUMBAR FIVE-SACRAL ONE ANTERIOR LUMBAR INTERBODY FUSION;  Surgeon: Donalee Citrin, MD;  Location: [facility] OR;  Service: Neurosurgery;  Laterality: N/A;   APPLICATION OF INTRAOPERATIVE CT SCAN N/A  05/05/2021   Procedure: AIRO stereotactic navigation;  Surgeon: Donalee Citrin, MD;  Location: [facility] OR;  Service: Neurosurgery;  Laterality: N/A;   BACK SURGERY     x2    BREAST SURGERY     Fibroid tumors removed   CESAREAN SECTION     COLONOSCOPY     x2   LITHOTRIPSY     "several"   LUMBAR LAMINECTOMY/DECOMPRESSION MICRODISCECTOMY  05/05/2021   Procedure: LEFT LUMBAR FIVE-SACRAL ONE MICRODISCECTOMY;  Surgeon: Donalee Citrin, MD;  Location[facility] OR;  Service: Neurosurgery;;   LUMBAR PERCUTANEOUS PEDICLE SCREW 1 LEVEL N/A 05/05/2021   Procedure: PLACEMENT OF LUMBAR SCREWS WITH PELVIC FIXATION USING ILIAC SCREWS;  Surgeon: Donalee Citrin, MD;  Location: [facility] OR;  Service: Neurosurgery;  Laterality: N/A;   TONSILLECTOMY     TOTAL KNEE ARTHROPLASTY Left 12/02/2022    History reviewed. No pertinent family history. Social History:  reports that she has never smoked. She has never used smokeless tobacco. She reports that she does not drink alcohol and does not use drugs.  Allergies:  Allergies  Allergen Reactions   Pioglitazone Swelling and Other (See Comments)    Leg swelling Fatigue    Primidone Palpitations, Shortness Of Breath and Other (See Comments)    DYSPNEA   Flexeril [Cyclobenzaprine] Other (See Comments)    Confusion   Codeine Nausea And Vomiting    Severe nausea     Medications Prior to Admission  Medication Sig Dispense Refill   busPIRone (BUSPAR)  10 MG tablet Take 10 mg by mouth 2 (two) times daily.     clotrimazole-betamethasone (LOTRISONE) cream Apply 1 Application topically 3 (three) times daily as needed (itching).     desvenlafaxine (PRISTIQ) 100 MG 24 hr tablet Take 100 mg by mouth daily.     gabapentin (NEURONTIN) 300 MG capsule Take 300-600 mg by mouth See admin instructions. Take 300 mg by mouth in the morning and afternoon and take 600 mg at bedtime     hydrochlorothiazide (HYDRODIURIL) 25 MG tablet Take 25 mg by mouth daily.     insulin regular (NOVOLIN R) 100 units/mL  injection Inject 4-10 Units into the skin 3 (three) times daily as needed (blood sugar above 200).     metFORMIN (GLUCOPHAGE-XR) 500 MG 24 hr tablet Take 500 mg by mouth in the morning and at bedtime.     NON FORMULARY Pt uses a cpap nightly     olmesartan (BENICAR) 20 MG tablet Take 20 mg by mouth daily.     omeprazole (PRILOSEC) 20 MG capsule Take 20 mg by mouth daily.     oxyCODONE-acetaminophen (PERCOCET) 10-325 MG tablet Take 1 tablet by mouth every 6 (six) hours as needed for pain.     potassium chloride SA (KLOR-CON) 20 MEQ tablet Take 20 mEq by mouth 2 (two) times daily.     pramipexole (MIRAPEX) 1.5 MG tablet Take 1 mg by mouth at bedtime.     pravastatin (PRAVACHOL) 10 MG tablet Take 10 mg by mouth daily.     acetaminophen (TYLENOL) 500 MG tablet Take 1,000-1,500 mg by mouth every 4 (four) hours as needed for moderate pain or headache.     aspirin 81 MG EC tablet Take 81 mg by mouth daily.     BELSOMRA 15 MG TABS Take 15 mg by mouth at bedtime as needed (sleep).     Blood Glucose Monitoring Suppl (ONE TOUCH ULTRA 2) w/Device KIT Use as instructed. Check blood sugar 2 times per day. Dx. E11.9     butalbital-acetaminophen-caffeine (FIORICET) 50-325-40 MG tablet Take 1 tablet by mouth every 6 (six) hours as needed for headache.     fluticasone (FLONASE) 50 MCG/ACT nasal spray Place 1 spray into both nostrils daily as needed for allergies.     Insulin Syringe-Needle U-100 (B-D INS SYR ULTRAFINE 1CC/30G) 30G X 1/2" 1 ML MISC See admin instructions.     Insulin Syringe-Needle U-100 25G X 1" 1 ML MISC Use 3 times a day as needed     meclizine (ANTIVERT) 25 MG tablet Take 25 mg by mouth every 6 (six) hours as needed for dizziness.     OneTouch Delica Lancets 30G MISC 1 each by Other route 2 times daily. Dx E11.9     tiZANidine (ZANAFLEX) 2 MG tablet Take 1 tablet (2 mg total) by mouth 3 (three) times daily. (Patient not taking: Reported on 05/17/2023) 45 tablet 0    Results for orders placed  or performed during the hospital encounter of 05/31/23 (from the past 48 hour(s))  Glucose, capillary     Status: Abnormal   Collection Time: 05/31/23  5:49 AM  Result Value Ref Range   Glucose-Capillary 130 (H) 70 - 99 mg/dL    Comment: Glucose reference range applies only to samples taken after fasting for at least 8 hours.   Comment 1 Notify RN    No results found.  Review of Systems  Musculoskeletal:  Positive for back pain.  Neurological:  Positive for numbness.  Blood pressure (!) 145/90, pulse 77, temperature 97.8 F (36.6 C), temperature source Oral, resp. rate 18, height 5\' 3"  (1.6 m), weight 73.5 kg, SpO2 94%. Physical Exam HENT:     Head: Normocephalic.     Right Ear: Tympanic membrane normal.     Nose: Nose normal.     Mouth/Throat:     Mouth: Mucous membranes are moist.  Eyes:     Pupils: Pupils are equal, round, and reactive to light.  Cardiovascular:     Rate and Rhythm: Normal rate.  Pulmonary:     Effort: Pulmonary effort is normal.  Abdominal:     General: Abdomen is flat.  Musculoskeletal:        General: Normal range of motion.     Cervical back: Normal range of motion.  Neurological:     Mental Status: She is alert.     Comments: Strength is 5 out of 5 iliopsoas, quads, hamstrings, gastroc, anterior tibialis, EHL.      Assessment/Plan Patient presents for thoracolumbar fusion extension of her fusion up to T10.  Mariam Dollar, MD 05/31/2023, 7:22 AM

## 2023-05-31 NOTE — Transfer of Care (Signed)
Immediate Anesthesia Transfer of Care Note  Patient: Tammy Hicks  Procedure(s) Performed: Exploration and extension of fusion - Thoracic ten-Thoracic eleven- Thoracic eleven-Thoracic twelve - Thoracic twelve-Lumbar one (Back)  Patient Location: PACU  Anesthesia Type:General  Level of Consciousness: awake, drowsy, and patient cooperative  Airway & Oxygen Therapy: Patient Spontanous Breathing and Patient connected to face mask oxygen  Post-op Assessment: Report given to RN and Post -op Vital signs reviewed and stable  Post vital signs: Reviewed and stable  Last Vitals:  Vitals Value Taken Time  BP 144/82 05/31/23 1049  Temp    Pulse 81 05/31/23 1054  Resp 17 05/31/23 1054  SpO2 91 % 05/31/23 1054  Vitals shown include unfiled device data.  Last Pain:  Vitals:   05/31/23 0611  TempSrc:   PainSc: 0-No pain         Complications: There were no known notable events for this encounter.

## 2023-06-01 LAB — GLUCOSE, CAPILLARY: Glucose-Capillary: 105 mg/dL — ABNORMAL HIGH (ref 70–99)

## 2023-06-01 NOTE — Progress Notes (Signed)
Patient alert and oriented, void, ambulate. Surgical site clean and dry, hemovac removed per order. D/c instructions explain and given all questions answered. Pt. D/c home per order.

## 2023-06-01 NOTE — Evaluation (Signed)
Occupational Therapy Evaluation Patient Details Name: Tammy Hicks MRN: [id number] DOB: 09-04-1946 Today's Date: 06/01/2023   History of Present Illness [name] is a 77 yo female who underwent Exploration and extension of fusion - Thoracic ten-Thoracic eleven- Thoracic eleven-Thoracic twelve - Thoracic twelve-Lumbar one 8/12. PMH anxiety, CKDIII DM Depression, migraines, Hx kidney stones, L TKA, HTN, OA, insomnia, PONV, restless leg syndrom Sleep apnea uses Cpap, back surgery x2   Clinical Impression   Tyleisha was evaluated s/p the above admission list. She is indep and lives with her husband at baseline. Upon evaluation the pt was limited by knowledge of back precautions, surgical pain, generalized weakness and unsteady gait. Overall she needed CGA- supervision A for mobility and ADLs with RW. However, she required maximal cues to maintain back precautions during all functional tasks. Pt does not require further acute OT services, recommend d/c to home with support of family.      If plan is discharge home, recommend the following: A little help with walking and/or transfers;A little help with bathing/dressing/bathroom;Assistance with cooking/housework;Assist for transportation;Help with stairs or ramp for entrance    Functional Status Assessment  Patient has had a recent decline in their functional status and demonstrates the ability to make significant improvements in function in a reasonable and predictable amount of time.  Equipment Recommendations  None recommended by OT       Precautions / Restrictions Precautions Precautions: Fall;Back Precaution Booklet Issued: Yes (comment) Required Braces or Orthoses: Spinal Brace Spinal Brace: Lumbar corset;Applied in sitting position Restrictions Weight Bearing Restrictions: No      Mobility Bed Mobility Overal bed mobility: Needs Assistance Bed Mobility: Rolling, Sidelying to Sit Rolling: Supervision Sidelying to sit: Supervision        General bed mobility comments: cues needed for log roll    Transfers Overall transfer level: Needs assistance Equipment used: Rolling walker (2 wheels) Transfers: Sit to/from Stand Sit to Stand: Supervision           General transfer comment: good hand placement for transfers, progessed to generalized supervision for mobility with RW. CGA for 2 steps.      Balance Overall balance assessment: Needs assistance Sitting-balance support: Feet supported Sitting balance-Leahy Scale: Good     Standing balance support: Single extremity supported, During functional activity Standing balance-Leahy Scale: Fair Standing balance comment: during ADLs, statically                           ADL either performed or assessed with clinical judgement   ADL Overall ADL's : Needs assistance/impaired                                     Functional mobility during ADLs: Supervision/safety;Rolling walker (2 wheels) General ADL Comments: No physical assist needed however pt needed constant cues to maintain back precautions for all ADLs and mobility     Vision Baseline Vision/History: 0 No visual deficits Vision Assessment?: No apparent visual deficits     Perception Perception: Not tested       Praxis Praxis: Not tested       Pertinent Vitals/Pain Pain Assessment Pain Assessment: Faces Faces Pain Scale: Hurts little more Pain Location: sx site Pain Descriptors / Indicators: Discomfort, Grimacing Pain Intervention(s): Limited activity within patient's tolerance, Monitored during session     Extremity/Trunk Assessment Upper Extremity Assessment Upper Extremity Assessment: Overall [facility]  for tasks assessed   Lower Extremity Assessment Lower Extremity Assessment: Generalized weakness   Cervical / Trunk Assessment Cervical / Trunk Assessment: Back Surgery   Communication Communication Communication: No apparent difficulties   Cognition Arousal:  Alert Behavior During Therapy: WFL for tasks assessed/performed Overall Cognitive Status: No family/caregiver present to determine baseline cognitive functioning                                 General Comments: unable to recall back precautions, needed cues to maintain throughout. Pt made light of her "memory issues"     General Comments  VSS, drain still placed.     Home Living Family/patient expects to be discharged to:: Private residence Living Arrangements: Spouse/significant other Available Help at Discharge: Family;Available 24 hours/day Type of Home: House Home Access: Stairs to enter [employer] of Steps: 2 Entrance Stairs-Rails: None Home Layout: One level     Bathroom Shower/Tub: Producer, television/film/video: Standard Bathroom Accessibility: Yes   Home Equipment: Agricultural consultant (2 wheels);Cane - single point;BSC/3in1          Prior Functioning/Environment Prior Level of Function : Independent/Modified Independent             Mobility Comments: recent TKA, progressed to not needing AD ADLs Comments: reports indep ADLs, has not drive since knee sx        OT Problem List: Decreased range of motion;Decreased activity tolerance;Impaired balance (sitting and/or standing);Decreased safety awareness;Decreased knowledge of use of DME or AE;Decreased knowledge of precautions;Pain      OT Treatment/Interventions: Self-care/ADL training;Therapeutic exercise;DME and/or AE instruction;Therapeutic activities;Patient/family education;Balance training    OT Goals(Current goals can be found in the care plan section) Acute Rehab OT Goals Patient Stated Goal: home OT Goal Formulation: With patient Time For Goal Achievement: 06/15/23 Potential to Achieve Goals: Good  OT Frequency: Min 1X/week       AM-PAC OT "6 Clicks" Daily Activity     Outcome Measure Help from another person eating meals?: None Help from another person taking care of  personal grooming?: A Little Help from another person toileting, which includes using toliet, bedpan, or urinal?: A Little Help from another person bathing (including washing, rinsing, drying)?: A Little Help from another person to put on and taking off regular upper body clothing?: A Little Help from another person to put on and taking off regular lower body clothing?: A Little 6 Click Score: 19   End of Session Equipment Utilized During Treatment: Rolling walker (2 wheels) Nurse Communication: Mobility status  Activity Tolerance: Patient tolerated treatment well Patient left: in chair;with call bell/phone within reach  OT Visit Diagnosis: Unsteadiness on feet (R26.81);Other abnormalities of gait and mobility (R26.89);Muscle weakness (generalized) (M62.81);Pain                Time: 0820-0839 OT Time Calculation (min): 19 min Charges:  OT General Charges $OT Visit: 1 Visit OT Evaluation $OT Eval Low Complexity: 1 Low  Derenda Mis, OTR/L Acute Rehabilitation Services Office [phone] Secure Chat Communication Preferred   Donia Pounds 06/01/2023, 8:53 AM

## 2023-06-01 NOTE — Discharge Summary (Signed)
Physician Discharge Summary  Patient ID: Tammy Hicks MRN: [id number] DOB/AGE: [birth year] 77 y.o. Estimated body mass index is 28.7 kg/m as calculated from the following:   Height as of this encounter: 5\' 3"  (1.6 m).   Weight as of this encounter: 73.5 kg.   Admit date: 05/31/2023 Discharge date: 06/01/2023  Admission Diagnoses: Degenerative disease T12-L1  Discharge Diagnoses: Same Principal Problem:   DDD (degenerative disc disease), lumbar   Discharged Condition: good  Hospital Course: Patient was admitted underwent extension of her fusion up to T10 postoperative patient did very well with covering the floor on the floor was ambulating and voiding spontaneously tolerating regular diet and stable for discharge home.  Consults: Significant Diagnostic Studies: Treatments: T10-L1 fusion Discharge Exam: Blood pressure 107/66, pulse 85, temperature 98.4 F (36.9 C), temperature source Oral, resp. rate 18, height 5\' 3"  (1.6 m), weight 73.5 kg, SpO2 92%. 5 and 5 wound clean dry and intact  Disposition: Home   Allergies as of 06/01/2023       Reactions   Pioglitazone Swelling, Other (See Comments)   Leg swelling Fatigue   Primidone Palpitations, Shortness Of Breath, Other (See Comments)   DYSPNEA   Flexeril [cyclobenzaprine] Other (See Comments)   Confusion   Codeine Nausea And Vomiting   Severe nausea         Medication List     TAKE these medications    acetaminophen 500 MG tablet Commonly known as: TYLENOL Take 1,000-1,500 mg by mouth every 4 (four) hours as needed for moderate pain or headache.   aspirin EC 81 MG tablet Take 81 mg by mouth daily.   Belsomra 15 MG Tabs Generic drug: Suvorexant Take 15 mg by mouth at bedtime as needed (sleep).   busPIRone 10 MG tablet Commonly known as: BUSPAR Take 10 mg by mouth 2 (two) times daily.   butalbital-acetaminophen-caffeine 50-325-40 MG tablet Commonly known as: FIORICET Take 1 tablet by mouth every 6  (six) hours as needed for headache.   clotrimazole-betamethasone cream Commonly known as: LOTRISONE Apply 1 Application topically 3 (three) times daily as needed (itching).   desvenlafaxine 100 MG 24 hr tablet Commonly known as: PRISTIQ Take 100 mg by mouth daily.   fluticasone 50 MCG/ACT nasal spray Commonly known as: FLONASE Place 1 spray into both nostrils daily as needed for allergies.   gabapentin 300 MG capsule Commonly known as: NEURONTIN Take 300-600 mg by mouth See admin instructions. Take 300 mg by mouth in the morning and afternoon and take 600 mg at bedtime   hydrochlorothiazide 25 MG tablet Commonly known as: HYDRODIURIL Take 25 mg by mouth daily.   insulin regular 100 units/mL injection Commonly known as: NOVOLIN R Inject 4-10 Units into the skin 3 (three) times daily as needed (blood sugar above 200).   Insulin Syringe-Needle U-100 25G X 1" 1 ML Misc Use 3 times a day as needed   B-D INS SYR ULTRAFINE 1CC/30G 30G X 1/2" 1 ML Misc Generic drug: Insulin Syringe-Needle U-100 See admin instructions.   meclizine 25 MG tablet Commonly known as: ANTIVERT Take 25 mg by mouth every 6 (six) hours as needed for dizziness.   metFORMIN 500 MG 24 hr tablet Commonly known as: GLUCOPHAGE-XR Take 500 mg by mouth in the morning and at bedtime.   NON FORMULARY Pt uses a cpap nightly   olmesartan 20 MG tablet Commonly known as: BENICAR Take 20 mg by mouth daily.   omeprazole 20 MG capsule Commonly known as: PRILOSEC Take  20 mg by mouth daily.   ONE TOUCH ULTRA 2 w/Device Kit Use as instructed. Check blood sugar 2 times per day. Dx. E11.9   OneTouch Delica Lancets 30G Misc 1 each by Other route 2 times daily. Dx E11.9   oxyCODONE-acetaminophen 10-325 MG tablet Commonly known as: PERCOCET Take 1 tablet by mouth every 6 (six) hours as needed for pain.   potassium chloride SA 20 MEQ tablet Commonly known as: KLOR-CON M Take 20 mEq by mouth 2 (two) times daily.    pramipexole 1.5 MG tablet Commonly known as: MIRAPEX Take 1 mg by mouth at bedtime.   pravastatin 10 MG tablet Commonly known as: PRAVACHOL Take 10 mg by mouth daily.   tiZANidine 2 MG tablet Commonly known as: ZANAFLEX Take 1 tablet (2 mg total) by mouth 3 (three) times daily.         Signed: Mariam Dollar 06/01/2023, 8:11 AM

## 2023-06-03 ENCOUNTER — Encounter (HOSPITAL_COMMUNITY): Payer: Self-pay | Admitting: Neurosurgery

## 2024-01-28 ENCOUNTER — Other Ambulatory Visit: Payer: Self-pay | Admitting: Neurosurgery

## 2024-02-07 NOTE — Pre-Procedure Instructions (Signed)
 Surgical Instructions   Your procedure is scheduled on Wednesday, April 30th. Report to [facility] Main Entrance "A" at 05:30 A.M., then check in with the Admitting office. Any questions or running late day of surgery: call [phone]  Questions prior to your surgery date: call [phone], Monday-Friday, 8am-4pm. If you experience any cold or flu symptoms such as cough, fever, chills, shortness of breath, etc. between now and your scheduled surgery, please notify us  at the above number.     Remember:  Do not eat or drink after midnight the night before your surgery    Take these medicines the morning of surgery with A SIP OF WATER  busPIRone  (BUSPAR )  desvenlafaxine (PRISTIQ)  gabapentin  (NEURONTIN )  omeprazole (PRILOSEC)  pravastatin  (PRAVACHOL )   May take these medicines IF NEEDED: acetaminophen  (TYLENOL )  fluticasone  (FLONASE )  meclizine  (ANTIVERT )  oxyCODONE -acetaminophen  (PERCOCET)   One week prior to surgery, STOP taking any Aspirin  (unless otherwise instructed by your surgeon) Aleve, Naproxen, Ibuprofen, Motrin, Advil, Goody's, BC's, all herbal medications, fish oil, and non-prescription vitamins. This includes diclofenac Sodium (VOLTAREN) gel and meloxicam (MOBIC).   WHAT DO I DO ABOUT MY DIABETES MEDICATION?   Do not take metFORMIN  (GLUCOPHAGE -XR) the morning of surgery.  If your CBG is greater than 220 mg/dL, you may take  of your sliding scale (correction) dose of insulin  regular (HUMULIN R ).   HOW TO MANAGE YOUR DIABETES BEFORE AND AFTER SURGERY  Why is it important to control my blood sugar before and after surgery? Improving blood sugar levels before and after surgery helps healing and can limit problems. A way of improving blood sugar control is eating a healthy diet by:  Eating less sugar and carbohydrates  Increasing activity/exercise  Talking with your doctor about reaching your blood sugar goals High blood sugars (greater than 180 mg/dL) can  raise your risk of infections and slow your recovery, so you will need to focus on controlling your diabetes during the weeks before surgery. Make sure that the doctor who takes care of your diabetes knows about your planned surgery including the date and location.  How do I manage my blood sugar before surgery? Check your blood sugar at least 4 times a day, starting 2 days before surgery, to make sure that the level is not too high or low.  Check your blood sugar the morning of your surgery when you wake up and every 2 hours until you get to the Short Stay unit.  If your blood sugar is less than 70 mg/dL, you will need to treat for low blood sugar: Do not take insulin . Treat a low blood sugar (less than 70 mg/dL) with  cup of clear juice (cranberry or apple), 4 glucose tablets, OR glucose gel. Recheck blood sugar in 15 minutes after treatment (to make sure it is greater than 70 mg/dL). If your blood sugar is not greater than 70 mg/dL on recheck, call [phone] for further instructions. Report your blood sugar to the short stay nurse when you get to Short Stay.  If you are admitted to the hospital after surgery: Your blood sugar will be checked by the staff and you will probably be given insulin  after surgery (instead of oral diabetes medicines) to make sure you have good blood sugar levels. The goal for blood sugar control after surgery is 80-180 mg/dL.                     Do NOT Smoke (Tobacco/Vaping) for 24 hours prior to  your procedure.  If you use a CPAP at night, you may bring your mask/headgear for your overnight stay.   You will be asked to remove any contacts, glasses, piercing's, hearing aid's, dentures/partials prior to surgery. Please bring cases for these items if needed.    Patients discharged the day of surgery will not be allowed to drive home, and someone needs to stay with them for 24 hours.  SURGICAL WAITING ROOM VISITATION Patients may have no more than 2 support  people in the waiting area - these visitors may rotate.   Pre-op nurse will coordinate an appropriate time for 1 ADULT support person, who may not rotate, to accompany patient in pre-op.  Children under the age of 72 must have an adult with them who is not the patient and must remain in the main waiting area with an adult.  If the patient needs to stay at the hospital during part of their recovery, the visitor guidelines for inpatient rooms apply.  Please refer to the [facility] website for the visitor guidelines for any additional information.   If you received a COVID test during your pre-op visit  it is requested that you wear a mask when out in public, stay away from anyone that may not be feeling well and notify your surgeon if you develop symptoms. If you have been in contact with anyone that has tested positive in the last 10 days please notify you surgeon.      Pre-operative 5 CHG Bathing Instructions   You can play a key role in reducing the risk of infection after surgery. Your skin needs to be as free of germs as possible. You can reduce the number of germs on your skin by washing with CHG (chlorhexidine  gluconate) soap before surgery. CHG is an antiseptic soap that kills germs and continues to kill germs even after washing.   DO NOT use if you have an allergy to chlorhexidine /CHG or antibacterial soaps. If your skin becomes reddened or irritated, stop using the CHG and notify one of our RNs at [phone].   Please shower with the CHG soap starting 4 days before surgery using the following schedule:     Please keep in mind the following:  DO NOT shave, including legs and underarms, starting the day of your first shower.   You may shave your face at any point before/day of surgery.  Place clean sheets on your bed the day you start using CHG soap. Use a clean washcloth (not used since being washed) for each shower. DO NOT sleep with pets once you start using the CHG.   CHG  Shower Instructions:  Wash your face and private area with normal soap. If you choose to wash your hair, wash first with your normal shampoo.  After you use shampoo/soap, rinse your hair and body thoroughly to remove shampoo/soap residue.  Turn the water OFF and apply about 3 tablespoons (45 ml) of CHG soap to a CLEAN washcloth.  Apply CHG soap ONLY FROM YOUR NECK DOWN TO YOUR TOES (washing for 3-5 minutes)  DO NOT use CHG soap on face, private areas, open wounds, or sores.  Pay special attention to the area where your surgery is being performed.  If you are having back surgery, having someone wash your back for you may be helpful. Wait 2 minutes after CHG soap is applied, then you may rinse off the CHG soap.  Pat dry with a clean towel  Put on clean clothes/pajamas  If you choose to wear lotion, please use ONLY the CHG-compatible lotions that are listed below.  Additional instructions for the day of surgery: DO NOT APPLY any lotions, deodorants, cologne, or perfumes.   Do not bring valuables to the hospital. [facility] is not responsible for any belongings/valuables. Do not wear nail polish, gel polish, artificial nails, or any other type of covering on natural nails (fingers and toes) Do not wear jewelry or makeup Put on clean/comfortable clothes.  Please brush your teeth.  Ask your nurse before applying any prescription medications to the skin.     CHG Compatible Lotions   Aveeno Moisturizing lotion  Cetaphil Moisturizing Cream  Cetaphil Moisturizing Lotion  Clairol Herbal Essence Moisturizing Lotion, Dry Skin  Clairol Herbal Essence Moisturizing Lotion, Extra Dry Skin  Clairol Herbal Essence Moisturizing Lotion, Normal Skin  Curel Age Defying Therapeutic Moisturizing Lotion with Alpha Hydroxy  Curel Extreme Care Body Lotion  Curel Soothing Hands Moisturizing Hand Lotion  Curel Therapeutic Moisturizing Cream, Fragrance-Free  Curel Therapeutic Moisturizing Lotion,  Fragrance-Free  Curel Therapeutic Moisturizing Lotion, Original Formula  Eucerin Daily Replenishing Lotion  Eucerin Dry Skin Therapy Plus Alpha Hydroxy Crme  Eucerin Dry Skin Therapy Plus Alpha Hydroxy Lotion  Eucerin Original Crme  Eucerin Original Lotion  Eucerin Plus Crme Eucerin Plus Lotion  Eucerin TriLipid Replenishing Lotion  Keri Anti-Bacterial Hand Lotion  Keri Deep Conditioning Original Lotion Dry Skin Formula Softly Scented  Keri Deep Conditioning Original Lotion, Fragrance Free Sensitive Skin Formula  Keri Lotion Fast Absorbing Fragrance Free Sensitive Skin Formula  Keri Lotion Fast Absorbing Softly Scented Dry Skin Formula  Keri Original Lotion  Keri Skin Renewal Lotion Keri Silky Smooth Lotion  Keri Silky Smooth Sensitive Skin Lotion  Nivea Body Creamy Conditioning Oil  Nivea Body Extra Enriched Lotion  Nivea Body Original Lotion  Nivea Body Sheer Moisturizing Lotion Nivea Crme  Nivea Skin Firming Lotion  NutraDerm 30 Skin Lotion  NutraDerm Skin Lotion  NutraDerm Therapeutic Skin Cream  NutraDerm Therapeutic Skin Lotion  ProShield Protective Hand Cream  Provon moisturizing lotion  Please read over the following fact sheets that you were given.

## 2024-02-08 ENCOUNTER — Other Ambulatory Visit: Payer: Self-pay

## 2024-02-08 ENCOUNTER — Encounter (HOSPITAL_COMMUNITY)
Admission: RE | Admit: 2024-02-08 | Discharge: 2024-02-08 | Disposition: A | Discharge status: Home / Self Care | Source: Ambulatory Visit | Attending: Neurosurgery | Admitting: Neurosurgery

## 2024-02-08 ENCOUNTER — Encounter (HOSPITAL_COMMUNITY): Payer: Self-pay

## 2024-02-08 VITALS — BP 138/75 | HR 79 | Temp 98.4°F | Resp 17 | Ht 63.0 in | Wt 161.0 lb

## 2024-02-08 DIAGNOSIS — R609 Edema, unspecified: Secondary | ICD-10-CM | POA: Insufficient documentation

## 2024-02-08 DIAGNOSIS — I872 Venous insufficiency (chronic) (peripheral): Secondary | ICD-10-CM | POA: Insufficient documentation

## 2024-02-08 DIAGNOSIS — G4733 Obstructive sleep apnea (adult) (pediatric): Secondary | ICD-10-CM | POA: Insufficient documentation

## 2024-02-08 DIAGNOSIS — N183 Chronic kidney disease, stage 3 unspecified: Secondary | ICD-10-CM | POA: Insufficient documentation

## 2024-02-08 DIAGNOSIS — E1122 Type 2 diabetes mellitus with diabetic chronic kidney disease: Secondary | ICD-10-CM | POA: Insufficient documentation

## 2024-02-08 DIAGNOSIS — Z01812 Encounter for preprocedural laboratory examination: Secondary | ICD-10-CM | POA: Diagnosis present

## 2024-02-08 DIAGNOSIS — I129 Hypertensive chronic kidney disease with stage 1 through stage 4 chronic kidney disease, or unspecified chronic kidney disease: Secondary | ICD-10-CM | POA: Insufficient documentation

## 2024-02-08 DIAGNOSIS — Z01818 Encounter for other preprocedural examination: Secondary | ICD-10-CM

## 2024-02-08 LAB — TYPE AND SCREEN
ABO/RH(D): A POS
Antibody Screen: NEGATIVE

## 2024-02-08 LAB — CBC
HCT: 31.2 % — ABNORMAL LOW (ref 36.0–46.0)
Hemoglobin: 10.1 g/dL — ABNORMAL LOW (ref 12.0–15.0)
MCH: 28.1 pg (ref 26.0–34.0)
MCHC: 32.4 g/dL (ref 30.0–36.0)
MCV: 86.9 fL (ref 80.0–100.0)
Platelets: 318 10*3/uL (ref 150–400)
RBC: 3.59 MIL/uL — ABNORMAL LOW (ref 3.87–5.11)
RDW: 14.5 % (ref 11.5–15.5)
WBC: 7 10*3/uL (ref 4.0–10.5)
nRBC: 0 % (ref 0.0–0.2)

## 2024-02-08 LAB — BASIC METABOLIC PANEL WITH GFR
Anion gap: 10 (ref 5–15)
BUN: 31 mg/dL — ABNORMAL HIGH (ref 8–23)
CO2: 24 mmol/L (ref 22–32)
Calcium: 9.5 mg/dL (ref 8.9–10.3)
Chloride: 104 mmol/L (ref 98–111)
Creatinine, Ser: 1.3 mg/dL — ABNORMAL HIGH (ref 0.44–1.00)
GFR, Estimated: 42 mL/min — ABNORMAL LOW (ref >60)
Glucose, Bld: 87 mg/dL (ref 70–99)
Potassium: 4.2 mmol/L (ref 3.5–5.1)
Sodium: 138 mmol/L (ref 135–145)

## 2024-02-08 LAB — SURGICAL PCR SCREEN
MRSA, PCR: NEGATIVE
Staphylococcus aureus: NEGATIVE

## 2024-02-08 LAB — GLUCOSE, CAPILLARY: Glucose-Capillary: 72 mg/dL (ref 70–99)

## 2024-02-08 NOTE — Progress Notes (Signed)
 PCP - Lieutenant Reese MD  Cardiologist -   PPM/ICD - denies Device Orders - n/a Rep Notified - n/a  Chest x-ray - 04-26-21 EKG - 05-31-23 Stress Test - 08-14-22 ECHO - denies Cardiac Cath - denies  Sleep Study - per patient < 5  years ago CPAP - unable to use due to sleeping in recliner in another room  Fasting Blood Sugar - Per patient blood sugars are under 150 Checks Blood Sugar BID Blood sugar at PAT appointment 73 patient denies any symptoms of low blood sugar per patient all she ate this am was a moon pie    Blood Thinner Instructions:denies Aspirin  Instructions:denies meloxicam (MOBIC) Last dose 02-08-24  ERAS Protcol -NPO   COVID TEST-    Anesthesia review: yes per patient had tested + with home test for Covid the beginning of February a week later tested -. Denies any cough or temperature  Patient denies shortness of breath, fever, cough and chest pain at PAT appointment   All instructions explained to the patient, with a verbal understanding of the material. Patient agrees to go over the instructions while at home for a better understanding. Patient also instructed to self quarantine after being tested for COVID-19. The opportunity to ask questions was provided.

## 2024-02-09 NOTE — Anesthesia Preprocedure Evaluation (Signed)
 Anesthesia Evaluation    Airway        Dental   Pulmonary           Cardiovascular hypertension,      Neuro/Psych    GI/Hepatic   Endo/Other  diabetes    Renal/GU      Musculoskeletal   Abdominal   Peds  Hematology   Anesthesia Other Findings   Reproductive/Obstetrics                              Anesthesia Physical Anesthesia Plan  ASA:   Anesthesia Plan:    Post-op Pain Management:    Induction:   PONV Risk Score and Plan:   Airway Management Planned:   Additional Equipment:   Intra-op Plan:   Post-operative Plan:   Informed Consent:   Plan Discussed with:   Anesthesia Plan Comments: (PAT note by Rudy Costain, PA-C:  78 year old female with pertinent history including IDDM 2 (A1c 6.9 on 01/27/2024), OSA not using CPAP, GERD, stage III CKD, HTN, PONV, venous insufficiency/LE edema.   Chronic medical conditions followed by PCP Dr. Griffin Lecher at [facility]  Last seen 01/27/2024, no acute concerns at that time, upcoming surgery discussed.   Patient previously had evaluation for atypical chest pain.  Nuclear stress test 08/14/2022 with no evidence of infarct or ischemia.  Prior TTE 11/18/2018 showed EF 65%, no significant valvular abnormalities.   Preop labs reviewed, creatinine mildly elevated 1.30 consistent with history of CKD, mild chronic anemia with hemoglobin 10.1.   EKG 05/31/2023: Normal sinus rhythm.  Rate 84. Right bundle branch block   Nuclear stress 08/13/2022 (Care Everywhere): 1.  Uneventful Lexiscan injection with no evidence of ischemia during  vasodilator stress.  2.  Normal myocardial perfusion imaging with no evidence of infarct or  ischemia  3. Normal left-ventricular systolic function with a calculated ejection  fraction of 85%.    TTE 11/18/2021 (Care Everywhere): SUMMARY  Left ventricular systolic function is normal.  LV ejection fraction = 60-65%.   There is aortic valve sclerosis.  There is no aortic stenosis.  There is trace aortic regurgitation.  There is no comparison study available.   )         Anesthesia Quick Evaluation

## 2024-02-09 NOTE — Progress Notes (Signed)
 Anesthesia Chart Review:  78 year old female with pertinent history including IDDM 2 (A1c 6.9 on 01/27/2024), OSA not using CPAP, GERD, stage III CKD, HTN, PONV, venous insufficiency/LE edema.   Chronic medical conditions followed by PCP Dr. Griffin Lecher at [facility].  Last seen 01/27/2024, no acute concerns at that time, upcoming surgery discussed.   Patient previously had evaluation for atypical chest pain.  Nuclear stress test 08/14/2022 with no evidence of infarct or ischemia.  Prior TTE 11/18/2018 showed EF 65%, no significant valvular abnormalities.   Preop labs reviewed, creatinine mildly elevated 1.30 consistent with history of CKD, mild chronic anemia with hemoglobin 10.1.   EKG 05/31/2023: Normal sinus rhythm.  Rate 84. Right bundle branch block   Nuclear stress 08/13/2022 (Care Everywhere): 1.  Uneventful Lexiscan injection with no evidence of ischemia during  vasodilator stress.  2.  Normal myocardial perfusion imaging with no evidence of infarct or  ischemia  3. Normal left-ventricular systolic function with a calculated ejection  fraction of 85%.    TTE 11/18/2021 (Care Everywhere): SUMMARY  Left ventricular systolic function is normal.  LV ejection fraction = 60-65%.  There is aortic valve sclerosis.  There is no aortic stenosis.  There is trace aortic regurgitation.  There is no comparison study available.    Edilia Gordon [facility] Short Stay Center/Anesthesiology Phone [phone] 02/09/2024 12:47 PM

## 2024-02-16 ENCOUNTER — Inpatient Hospital Stay (HOSPITAL_COMMUNITY): Admission: RE | Admit: 2024-02-16 | Source: Home / Self Care | Admitting: Neurosurgery

## 2024-02-16 ENCOUNTER — Encounter (HOSPITAL_COMMUNITY): Payer: Self-pay | Admitting: Physician Assistant

## 2024-02-16 ENCOUNTER — Encounter (HOSPITAL_COMMUNITY): Admission: RE | Payer: Self-pay | Source: Home / Self Care

## 2024-02-16 SURGERY — POSTERIOR LUMBAR FUSION 1 WITH HARDWARE REMOVAL
Anesthesia: General | Site: Back

## 2024-02-24 NOTE — Progress Notes (Signed)
 Patient aware of new date and time of arrival for procedure.  Patient states that she cancelled her original procedure that was scheduled for 4/30 because on 4/29 she was vomiting.  She stated that her symptoms resolved the same day and has no issues since.     Patient instructed to re-read information that was given in PAT and to call back if she had further questions.

## 2024-02-28 ENCOUNTER — Inpatient Hospital Stay (HOSPITAL_COMMUNITY)

## 2024-02-28 ENCOUNTER — Other Ambulatory Visit: Payer: Self-pay

## 2024-02-28 ENCOUNTER — Encounter (HOSPITAL_COMMUNITY): Payer: Self-pay | Admitting: Neurosurgery

## 2024-02-28 ENCOUNTER — Inpatient Hospital Stay (HOSPITAL_COMMUNITY)
Admission: RE | Disposition: A | Discharge status: Home / Self Care | Payer: Self-pay | Source: Home / Self Care | Attending: Neurosurgery

## 2024-02-28 ENCOUNTER — Inpatient Hospital Stay (HOSPITAL_COMMUNITY)
Admission: RE | Admit: 2024-02-28 | Discharge: 2024-02-29 | DRG: 451 | Disposition: A | Discharge status: Home / Self Care | Attending: Neurosurgery | Admitting: Neurosurgery

## 2024-02-28 DIAGNOSIS — T8484XA Pain due to internal orthopedic prosthetic devices, implants and grafts, initial encounter: Secondary | ICD-10-CM | POA: Diagnosis present

## 2024-02-28 DIAGNOSIS — Z888 Allergy status to other drugs, medicaments and biological substances status: Secondary | ICD-10-CM | POA: Diagnosis not present

## 2024-02-28 DIAGNOSIS — Z96652 Presence of left artificial knee joint: Secondary | ICD-10-CM | POA: Diagnosis present

## 2024-02-28 DIAGNOSIS — G4733 Obstructive sleep apnea (adult) (pediatric): Secondary | ICD-10-CM | POA: Diagnosis present

## 2024-02-28 DIAGNOSIS — F32A Depression, unspecified: Secondary | ICD-10-CM | POA: Diagnosis present

## 2024-02-28 DIAGNOSIS — N189 Chronic kidney disease, unspecified: Secondary | ICD-10-CM

## 2024-02-28 DIAGNOSIS — T84038A Mechanical loosening of other internal prosthetic joint, initial encounter: Secondary | ICD-10-CM | POA: Diagnosis present

## 2024-02-28 DIAGNOSIS — Y828 Other medical devices associated with adverse incidents: Secondary | ICD-10-CM | POA: Diagnosis present

## 2024-02-28 DIAGNOSIS — I129 Hypertensive chronic kidney disease with stage 1 through stage 4 chronic kidney disease, or unspecified chronic kidney disease: Secondary | ICD-10-CM | POA: Diagnosis present

## 2024-02-28 DIAGNOSIS — G4730 Sleep apnea, unspecified: Secondary | ICD-10-CM | POA: Diagnosis present

## 2024-02-28 DIAGNOSIS — F419 Anxiety disorder, unspecified: Secondary | ICD-10-CM | POA: Diagnosis present

## 2024-02-28 DIAGNOSIS — E1122 Type 2 diabetes mellitus with diabetic chronic kidney disease: Secondary | ICD-10-CM | POA: Diagnosis present

## 2024-02-28 DIAGNOSIS — K219 Gastro-esophageal reflux disease without esophagitis: Secondary | ICD-10-CM | POA: Diagnosis present

## 2024-02-28 DIAGNOSIS — E785 Hyperlipidemia, unspecified: Secondary | ICD-10-CM | POA: Diagnosis present

## 2024-02-28 DIAGNOSIS — G2581 Restless legs syndrome: Secondary | ICD-10-CM | POA: Diagnosis present

## 2024-02-28 DIAGNOSIS — Z885 Allergy status to narcotic agent status: Secondary | ICD-10-CM

## 2024-02-28 DIAGNOSIS — Z79899 Other long term (current) drug therapy: Secondary | ICD-10-CM

## 2024-02-28 DIAGNOSIS — M960 Pseudarthrosis after fusion or arthrodesis: Secondary | ICD-10-CM | POA: Diagnosis present

## 2024-02-28 DIAGNOSIS — Z87442 Personal history of urinary calculi: Secondary | ICD-10-CM | POA: Diagnosis not present

## 2024-02-28 DIAGNOSIS — Z90710 Acquired absence of both cervix and uterus: Secondary | ICD-10-CM | POA: Diagnosis not present

## 2024-02-28 DIAGNOSIS — Z981 Arthrodesis status: Secondary | ICD-10-CM | POA: Diagnosis not present

## 2024-02-28 DIAGNOSIS — G43909 Migraine, unspecified, not intractable, without status migrainosus: Secondary | ICD-10-CM | POA: Diagnosis present

## 2024-02-28 DIAGNOSIS — N183 Chronic kidney disease, stage 3 unspecified: Secondary | ICD-10-CM | POA: Diagnosis present

## 2024-02-28 DIAGNOSIS — Z794 Long term (current) use of insulin: Secondary | ICD-10-CM | POA: Diagnosis not present

## 2024-02-28 DIAGNOSIS — S32009K Unspecified fracture of unspecified lumbar vertebra, subsequent encounter for fracture with nonunion: Principal | ICD-10-CM | POA: Diagnosis present

## 2024-02-28 DIAGNOSIS — Z7984 Long term (current) use of oral hypoglycemic drugs: Secondary | ICD-10-CM | POA: Diagnosis not present

## 2024-02-28 LAB — GLUCOSE, CAPILLARY
Glucose-Capillary: 95 mg/dL (ref 70–99)
Glucose-Capillary: 108 mg/dL — ABNORMAL HIGH (ref 70–99)
Glucose-Capillary: 126 mg/dL — ABNORMAL HIGH (ref 70–99)
Glucose-Capillary: 266 mg/dL — ABNORMAL HIGH (ref 70–99)
Glucose-Capillary: 167 mg/dL — ABNORMAL HIGH (ref 70–99)

## 2024-02-28 LAB — TYPE AND SCREEN
ABO/RH(D): A POS
Antibody Screen: NEGATIVE

## 2024-02-28 SURGERY — POSTERIOR LUMBAR FUSION 1 WITH HARDWARE REMOVAL
Anesthesia: General | Site: Back

## 2024-02-28 MED ORDER — ACETAMINOPHEN 500 MG PO TABS: 1000.0000 mg | ORAL_TABLET | ORAL | Status: DC | PRN

## 2024-02-28 MED ORDER — POTASSIUM CHLORIDE CRYS ER 20 MEQ PO TBCR
20.0000 meq | EXTENDED_RELEASE_TABLET | Freq: Two times a day (BID) | ORAL | Status: DC
  Administered 2024-02-28 (×2): 20 meq via ORAL
  Filled 2024-02-28 (×2): qty 1

## 2024-02-28 MED ORDER — ROCURONIUM BROMIDE 10 MG/ML (PF) SYRINGE
PREFILLED_SYRINGE | INTRAVENOUS | Status: DC | PRN
  Administered 2024-02-28: 50 mg via INTRAVENOUS
  Administered 2024-02-28: 10 mg via INTRAVENOUS

## 2024-02-28 MED ORDER — LIDOCAINE-EPINEPHRINE 1 %-1:100000 IJ SOLN
INTRAMUSCULAR | Status: DC | PRN
  Administered 2024-02-28: 10 mL

## 2024-02-28 MED ORDER — PHENOL 1.4 % MT LIQD: 1.0000 | OROMUCOSAL | Status: DC | PRN

## 2024-02-28 MED ORDER — FUROSEMIDE 40 MG PO TABS
40.0000 mg | ORAL_TABLET | Freq: Every day | ORAL | Status: DC
  Administered 2024-02-28: 40 mg via ORAL
  Filled 2024-02-28: qty 1

## 2024-02-28 MED ORDER — PROPOFOL 10 MG/ML IV BOLUS
INTRAVENOUS | Status: DC | PRN
Start: 2024-02-28 — End: 2024-02-28
  Administered 2024-02-28: 130 mg via INTRAVENOUS
  Administered 2024-02-28: 150 ug/kg/min via INTRAVENOUS

## 2024-02-28 MED ORDER — BUPIVACAINE HCL (PF) 0.25 % IJ SOLN
INTRAMUSCULAR | Status: AC
Start: 2024-02-28 — End: ?
  Filled 2024-02-28: qty 30

## 2024-02-28 MED ORDER — CYCLOBENZAPRINE HCL 10 MG PO TABS: 10.0000 mg | ORAL_TABLET | Freq: Three times a day (TID) | ORAL | Status: DC | PRN

## 2024-02-28 MED ORDER — PRAVASTATIN SODIUM 10 MG PO TABS: 10.0000 mg | ORAL_TABLET | Freq: Every day | ORAL | Status: DC

## 2024-02-28 MED ORDER — GLYCOPYRROLATE 0.2 MG/ML IJ SOLN
INTRAMUSCULAR | Status: DC | PRN
  Administered 2024-02-28: .2 mg via INTRAVENOUS

## 2024-02-28 MED ORDER — DEXAMETHASONE SODIUM PHOSPHATE 10 MG/ML IJ SOLN
INTRAMUSCULAR | Status: DC | PRN
  Administered 2024-02-28: 10 mg via INTRAVENOUS

## 2024-02-28 MED ORDER — FENTANYL CITRATE (PF) 250 MCG/5ML IJ SOLN
INTRAMUSCULAR | Status: AC
  Filled 2024-02-28: qty 5

## 2024-02-28 MED ORDER — FENTANYL CITRATE (PF) 250 MCG/5ML IJ SOLN
INTRAMUSCULAR | Status: DC | PRN
  Administered 2024-02-28: 100 ug via INTRAVENOUS

## 2024-02-28 MED ORDER — CHLORHEXIDINE GLUCONATE CLOTH 2 % EX PADS: 6.0000 | MEDICATED_PAD | Freq: Once | CUTANEOUS | Status: DC

## 2024-02-28 MED ORDER — PROPOFOL 10 MG/ML IV BOLUS
INTRAVENOUS | Status: AC
  Filled 2024-02-28: qty 20

## 2024-02-28 MED ORDER — OXYCODONE HCL 5 MG PO TABS
5.0000 mg | ORAL_TABLET | Freq: Four times a day (QID) | ORAL | Status: DC | PRN
  Administered 2024-02-28 – 2024-02-29 (×2): 5 mg via ORAL
  Filled 2024-02-28 (×3): qty 1

## 2024-02-28 MED ORDER — MENTHOL 3 MG MT LOZG
1.0000 | LOZENGE | OROMUCOSAL | Status: DC | PRN
  Administered 2024-02-29: 3 mg via ORAL
  Filled 2024-02-28: qty 9

## 2024-02-28 MED ORDER — MECLIZINE HCL 25 MG PO TABS: 25.0000 mg | ORAL_TABLET | Freq: Four times a day (QID) | ORAL | Status: DC | PRN

## 2024-02-28 MED ORDER — MIDAZOLAM HCL 2 MG/2ML IJ SOLN
INTRAMUSCULAR | Status: AC
  Filled 2024-02-28: qty 2

## 2024-02-28 MED ORDER — ONDANSETRON HCL 4 MG PO TABS: 4.0000 mg | ORAL_TABLET | Freq: Four times a day (QID) | ORAL | Status: DC | PRN

## 2024-02-28 MED ORDER — ONDANSETRON HCL 4 MG/2ML IJ SOLN: 4.0000 mg | Freq: Four times a day (QID) | INTRAMUSCULAR | Status: DC | PRN

## 2024-02-28 MED ORDER — PANTOPRAZOLE SODIUM 40 MG PO TBEC
40.0000 mg | DELAYED_RELEASE_TABLET | Freq: Every day | ORAL | Status: DC
  Administered 2024-02-28: 40 mg via ORAL
  Filled 2024-02-28: qty 1

## 2024-02-28 MED ORDER — INSULIN ASPART 100 UNIT/ML IJ SOLN
0.0000 [IU] | INTRAMUSCULAR | Status: DC | PRN
Start: 2024-02-28 — End: 2024-02-28

## 2024-02-28 MED ORDER — ROCURONIUM BROMIDE 10 MG/ML (PF) SYRINGE
PREFILLED_SYRINGE | INTRAVENOUS | Status: AC
  Filled 2024-02-28: qty 10

## 2024-02-28 MED ORDER — ACETAMINOPHEN 325 MG PO TABS: 650.0000 mg | ORAL_TABLET | ORAL | Status: DC | PRN

## 2024-02-28 MED ORDER — METFORMIN HCL ER 500 MG PO TB24
500.0000 mg | ORAL_TABLET | Freq: Two times a day (BID) | ORAL | Status: DC
  Administered 2024-02-28: 500 mg via ORAL
  Filled 2024-02-28: qty 1

## 2024-02-28 MED ORDER — GABAPENTIN 300 MG PO CAPS
600.0000 mg | ORAL_CAPSULE | Freq: Every day | ORAL | Status: DC
  Filled 2024-02-28: qty 2

## 2024-02-28 MED ORDER — SODIUM CHLORIDE 0.9 % IV SOLN
250.0000 mL | INTRAVENOUS | Status: DC
  Administered 2024-02-28: 250 mL via INTRAVENOUS

## 2024-02-28 MED ORDER — GABAPENTIN 300 MG PO CAPS: 300.0000 mg | ORAL_CAPSULE | Freq: Every morning | ORAL | Status: DC

## 2024-02-28 MED ORDER — THROMBIN 5000 UNITS EX KIT
PACK | CUTANEOUS | Status: AC
  Filled 2024-02-28: qty 2

## 2024-02-28 MED ORDER — BUPIVACAINE HCL (PF) 0.25 % IJ SOLN
INTRAMUSCULAR | Status: DC | PRN
  Administered 2024-02-28: 10 mL

## 2024-02-28 MED ORDER — ORAL CARE MOUTH RINSE: 15.0000 mL | Freq: Once | OROMUCOSAL | Status: AC

## 2024-02-28 MED ORDER — CHLORHEXIDINE GLUCONATE 0.12 % MT SOLN
OROMUCOSAL | Status: AC
  Administered 2024-02-28: 15 mL via OROMUCOSAL
  Filled 2024-02-28: qty 15

## 2024-02-28 MED ORDER — CEFAZOLIN SODIUM-DEXTROSE 2-4 GM/100ML-% IV SOLN
INTRAVENOUS | Status: AC
  Filled 2024-02-28: qty 100

## 2024-02-28 MED ORDER — BUTALBITAL-APAP-CAFFEINE 50-325-40 MG PO TABS: 1.0000 | ORAL_TABLET | Freq: Four times a day (QID) | ORAL | Status: DC | PRN

## 2024-02-28 MED ORDER — GLYCOPYRROLATE PF 0.2 MG/ML IJ SOSY
PREFILLED_SYRINGE | INTRAMUSCULAR | Status: AC
  Filled 2024-02-28: qty 1

## 2024-02-28 MED ORDER — ALUM & MAG HYDROXIDE-SIMETH 200-200-20 MG/5ML PO SUSP: 30.0000 mL | Freq: Four times a day (QID) | ORAL | Status: DC | PRN

## 2024-02-28 MED ORDER — SENNA 8.6 MG PO TABS
1.0000 | ORAL_TABLET | Freq: Two times a day (BID) | ORAL | Status: DC | PRN
Start: 2024-02-28 — End: 2024-02-29
  Administered 2024-02-28: 8.6 mg via ORAL
  Filled 2024-02-28: qty 1

## 2024-02-28 MED ORDER — THROMBIN (RECOMBINANT) 5000 UNITS EX SOLR: CUTANEOUS | Status: DC | PRN

## 2024-02-28 MED ORDER — ONDANSETRON HCL 4 MG/2ML IJ SOLN
INTRAMUSCULAR | Status: DC | PRN
  Administered 2024-02-28: 4 mg via INTRAVENOUS

## 2024-02-28 MED ORDER — PRAMIPEXOLE DIHYDROCHLORIDE 1.5 MG PO TABS
1.5000 mg | ORAL_TABLET | Freq: Every day | ORAL | Status: DC
  Administered 2024-02-28: 1.5 mg via ORAL
  Filled 2024-02-28: qty 1

## 2024-02-28 MED ORDER — PHENYLEPHRINE 80 MCG/ML (10ML) SYRINGE FOR IV PUSH (FOR BLOOD PRESSURE SUPPORT)
PREFILLED_SYRINGE | INTRAVENOUS | Status: DC | PRN
  Administered 2024-02-28 (×2): 160 ug via INTRAVENOUS

## 2024-02-28 MED ORDER — GABAPENTIN 100 MG PO CAPS
200.0000 mg | ORAL_CAPSULE | Freq: Every day | ORAL | Status: DC
  Administered 2024-02-28: 200 mg via ORAL
  Filled 2024-02-28: qty 2

## 2024-02-28 MED ORDER — LIDOCAINE 2% (20 MG/ML) 5 ML SYRINGE
INTRAMUSCULAR | Status: AC
  Filled 2024-02-28: qty 5

## 2024-02-28 MED ORDER — MIDAZOLAM HCL 2 MG/2ML IJ SOLN
INTRAMUSCULAR | Status: DC | PRN
  Administered 2024-02-28 (×2): 1 mg via INTRAVENOUS

## 2024-02-28 MED ORDER — INSULIN ASPART 100 UNIT/ML IJ SOLN
0.0000 [IU] | Freq: Three times a day (TID) | INTRAMUSCULAR | Status: DC
  Administered 2024-02-28: 8 [IU] via SUBCUTANEOUS

## 2024-02-28 MED ORDER — PHENYLEPHRINE 80 MCG/ML (10ML) SYRINGE FOR IV PUSH (FOR BLOOD PRESSURE SUPPORT)
PREFILLED_SYRINGE | INTRAVENOUS | Status: AC
  Filled 2024-02-28: qty 10

## 2024-02-28 MED ORDER — ONDANSETRON HCL 4 MG/2ML IJ SOLN
INTRAMUSCULAR | Status: AC
  Filled 2024-02-28: qty 2

## 2024-02-28 MED ORDER — AMISULPRIDE (ANTIEMETIC) 5 MG/2ML IV SOLN: 10.0000 mg | Freq: Once | INTRAVENOUS | Status: DC | PRN

## 2024-02-28 MED ORDER — LIDOCAINE 2% (20 MG/ML) 5 ML SYRINGE
INTRAMUSCULAR | Status: DC | PRN
Start: 2024-02-28 — End: 2024-02-28
  Administered 2024-02-28: 50 mg via INTRAVENOUS

## 2024-02-28 MED ORDER — GABAPENTIN 100 MG PO CAPS: 100.0000 mg | ORAL_CAPSULE | Freq: Two times a day (BID) | ORAL | Status: DC

## 2024-02-28 MED ORDER — LIDOCAINE-EPINEPHRINE 1 %-1:100000 IJ SOLN
INTRAMUSCULAR | Status: AC
  Filled 2024-02-28: qty 1

## 2024-02-28 MED ORDER — DOCUSATE SODIUM 100 MG PO CAPS
100.0000 mg | ORAL_CAPSULE | Freq: Two times a day (BID) | ORAL | Status: DC | PRN
  Administered 2024-02-28: 100 mg via ORAL
  Filled 2024-02-28: qty 1

## 2024-02-28 MED ORDER — SUGAMMADEX SODIUM 200 MG/2ML IV SOLN
INTRAVENOUS | Status: DC | PRN
  Administered 2024-02-28: 200 mg via INTRAVENOUS

## 2024-02-28 MED ORDER — CHLORHEXIDINE GLUCONATE 0.12 % MT SOLN: 15.0000 mL | Freq: Once | OROMUCOSAL | Status: AC

## 2024-02-28 MED ORDER — 0.9 % SODIUM CHLORIDE (POUR BTL) OPTIME
TOPICAL | Status: DC | PRN
  Administered 2024-02-28: 1000 mL

## 2024-02-28 MED ORDER — FLUTICASONE PROPIONATE 50 MCG/ACT NA SUSP: 1.0000 | Freq: Every day | NASAL | Status: DC | PRN

## 2024-02-28 MED ORDER — IRBESARTAN 75 MG PO TABS
37.5000 mg | ORAL_TABLET | Freq: Every day | ORAL | Status: DC
  Administered 2024-02-28: 37.5 mg via ORAL
  Filled 2024-02-28: qty 1

## 2024-02-28 MED ORDER — HYDROCHLOROTHIAZIDE 25 MG PO TABS
25.0000 mg | ORAL_TABLET | Freq: Every day | ORAL | Status: DC
  Administered 2024-02-28 – 2024-02-29 (×2): 25 mg via ORAL
  Filled 2024-02-28 (×2): qty 1

## 2024-02-28 MED ORDER — OXYCODONE-ACETAMINOPHEN 5-325 MG PO TABS
1.0000 | ORAL_TABLET | Freq: Four times a day (QID) | ORAL | Status: DC | PRN
  Administered 2024-02-28 – 2024-02-29 (×2): 1 via ORAL
  Filled 2024-02-28 (×3): qty 1

## 2024-02-28 MED ORDER — CEFAZOLIN SODIUM-DEXTROSE 2-4 GM/100ML-% IV SOLN
2.0000 g | INTRAVENOUS | Status: AC
  Administered 2024-02-28: 2 g via INTRAVENOUS

## 2024-02-28 MED ORDER — FENTANYL CITRATE (PF) 100 MCG/2ML IJ SOLN: 25.0000 ug | INTRAMUSCULAR | Status: DC | PRN

## 2024-02-28 MED ORDER — VENLAFAXINE HCL ER 37.5 MG PO CP24
37.5000 mg | ORAL_CAPSULE | Freq: Every day | ORAL | Status: DC
  Filled 2024-02-28: qty 1

## 2024-02-28 MED ORDER — PANTOPRAZOLE SODIUM 40 MG IV SOLR: 40.0000 mg | Freq: Every day | INTRAVENOUS | Status: DC

## 2024-02-28 MED ORDER — CEFAZOLIN SODIUM-DEXTROSE 2-4 GM/100ML-% IV SOLN
2.0000 g | Freq: Three times a day (TID) | INTRAVENOUS | Status: AC
  Administered 2024-02-28 – 2024-02-29 (×2): 2 g via INTRAVENOUS
  Filled 2024-02-28 (×2): qty 100

## 2024-02-28 MED ORDER — TIZANIDINE HCL 2 MG PO TABS
2.0000 mg | ORAL_TABLET | Freq: Three times a day (TID) | ORAL | Status: DC
  Administered 2024-02-28: 2 mg via ORAL
  Filled 2024-02-28 (×3): qty 1

## 2024-02-28 MED ORDER — ACETAMINOPHEN 650 MG RE SUPP: 650.0000 mg | RECTAL | Status: DC | PRN

## 2024-02-28 MED ORDER — DEXAMETHASONE SODIUM PHOSPHATE 10 MG/ML IJ SOLN
INTRAMUSCULAR | Status: AC
  Filled 2024-02-28: qty 1

## 2024-02-28 MED ORDER — OXYCODONE-ACETAMINOPHEN 10-325 MG PO TABS: 1.0000 | ORAL_TABLET | Freq: Four times a day (QID) | ORAL | Status: DC | PRN

## 2024-02-28 MED ORDER — SODIUM CHLORIDE 0.9% FLUSH
3.0000 mL | Freq: Two times a day (BID) | INTRAVENOUS | Status: DC
  Administered 2024-02-28 (×2): 3 mL via INTRAVENOUS

## 2024-02-28 MED ORDER — HYDROMORPHONE HCL 1 MG/ML IJ SOLN
0.5000 mg | INTRAMUSCULAR | Status: DC | PRN
  Administered 2024-02-28: 0.5 mg via INTRAVENOUS
  Filled 2024-02-28: qty 0.5

## 2024-02-28 MED ORDER — LACTATED RINGERS IV SOLN: INTRAVENOUS | Status: DC | PRN

## 2024-02-28 MED ORDER — GABAPENTIN 300 MG PO CAPS
300.0000 mg | ORAL_CAPSULE | ORAL | Status: DC
Start: 2024-02-28 — End: 2024-02-28

## 2024-02-28 MED ORDER — SODIUM CHLORIDE 0.9% FLUSH: 3.0000 mL | INTRAVENOUS | Status: DC | PRN

## 2024-02-28 MED ORDER — SUVOREXANT 15 MG PO TABS: 15.0000 mg | ORAL_TABLET | Freq: Every evening | ORAL | Status: DC | PRN

## 2024-02-28 MED ORDER — BUSPIRONE HCL 10 MG PO TABS
10.0000 mg | ORAL_TABLET | Freq: Three times a day (TID) | ORAL | Status: DC
  Administered 2024-02-28 – 2024-02-29 (×3): 10 mg via ORAL
  Filled 2024-02-28 (×3): qty 1

## 2024-02-28 SURGICAL SUPPLY — 62 items
BAG COUNTER SPONGE SURGICOUNT (BAG) ×1 IMPLANT
BASKET BONE COLLECTION (BASKET) ×1 IMPLANT
BENZOIN TINCTURE PRP APPL 2/3 (GAUZE/BANDAGES/DRESSINGS) ×1 IMPLANT
BLADE BONE MILL MEDIUM (MISCELLANEOUS) ×1 IMPLANT
BLADE CLIPPER SURG (BLADE) IMPLANT
BLADE SURG 11 STRL SS (BLADE) ×1 IMPLANT
BONE VIVIGEN FORMABLE 5.4CC (Bone Implant) ×1 IMPLANT
BUR CUTTER 7.0 ROUND (BURR) ×1 IMPLANT
BUR MATCHSTICK NEURO 3.0 LAGG (BURR) ×1 IMPLANT
CANISTER SUCTION 3000ML PPV (SUCTIONS) ×1 IMPLANT
CNTNR URN SCR LID CUP LEK RST (MISCELLANEOUS) ×1 IMPLANT
COVER BACK TABLE 60X90IN (DRAPES) ×1 IMPLANT
DERMABOND ADVANCED .7 DNX12 (GAUZE/BANDAGES/DRESSINGS) ×1 IMPLANT
DRAPE C-ARM 42X72 X-RAY (DRAPES) ×2 IMPLANT
DRAPE C-ARMOR (DRAPES) IMPLANT
DRAPE HALF SHEET 40X57 (DRAPES) IMPLANT
DRAPE LAPAROTOMY 100X72X124 (DRAPES) ×1 IMPLANT
DRAPE SURG 17X23 STRL (DRAPES) ×1 IMPLANT
DRSG OPSITE 4X5.5 SM (GAUZE/BANDAGES/DRESSINGS) ×1 IMPLANT
DRSG OPSITE POSTOP 4X6 (GAUZE/BANDAGES/DRESSINGS) ×1 IMPLANT
DURAPREP 26ML APPLICATOR (WOUND CARE) ×1 IMPLANT
ELECTRODE REM PT RTRN 9FT ADLT (ELECTROSURGICAL) ×1 IMPLANT
EVACUATOR 1/8 PVC DRAIN (DRAIN) ×1 IMPLANT
GAUZE 4X4 16PLY ~~LOC~~+RFID DBL (SPONGE) IMPLANT
GAUZE SPONGE 4X4 12PLY STRL (GAUZE/BANDAGES/DRESSINGS) ×1 IMPLANT
GLOVE BIO SURGEON STRL SZ7 (GLOVE) IMPLANT
GLOVE BIO SURGEON STRL SZ8 (GLOVE) ×2 IMPLANT
GLOVE BIOGEL PI IND STRL 7.0 (GLOVE) IMPLANT
GLOVE EXAM NITRILE XL STR (GLOVE) IMPLANT
GLOVE INDICATOR 8.5 STRL (GLOVE) ×4 IMPLANT
GOWN STRL REUS W/ TWL LRG LVL3 (GOWN DISPOSABLE) IMPLANT
GOWN STRL REUS W/ TWL XL LVL3 (GOWN DISPOSABLE) ×2 IMPLANT
GOWN STRL REUS W/TWL 2XL LVL3 (GOWN DISPOSABLE) IMPLANT
GRAFT BN 5X1XSPNE CVD POST DBM (Bone Implant) IMPLANT
GRAFT BNE MATRIX VG FRMBL MD 5 (Bone Implant) IMPLANT
KIT BASIN OR (CUSTOM PROCEDURE TRAY) ×1 IMPLANT
KIT INFUSE X SMALL 1.4CC (Orthopedic Implant) IMPLANT
KIT TURNOVER KIT B (KITS) ×1 IMPLANT
MILL BONE PREP (MISCELLANEOUS) ×1 IMPLANT
NDL HYPO 21X1.5 SAFETY (NEEDLE) ×1 IMPLANT
NDL HYPO 25X1 1.5 SAFETY (NEEDLE) ×1 IMPLANT
NEEDLE HYPO 21X1.5 SAFETY (NEEDLE) ×1 IMPLANT
NEEDLE HYPO 25X1 1.5 SAFETY (NEEDLE) IMPLANT
NS IRRIG 1000ML POUR BTL (IV SOLUTION) ×1 IMPLANT
PACK LAMINECTOMY NEURO (CUSTOM PROCEDURE TRAY) ×1 IMPLANT
PAD ARMBOARD POSITIONER FOAM (MISCELLANEOUS) ×3 IMPLANT
PATTIES SURGICAL .5 X.5 (GAUZE/BANDAGES/DRESSINGS) ×1 IMPLANT
PATTIES SURGICAL 1X1 (DISPOSABLE) ×1 IMPLANT
RASP 3.0MM (RASP) IMPLANT
SPIKE FLUID TRANSFER (MISCELLANEOUS) ×1 IMPLANT
SPONGE SURGIFOAM ABS GEL 100 (HEMOSTASIS) ×1 IMPLANT
SPONGE T-LAP 4X18 ~~LOC~~+RFID (SPONGE) IMPLANT
STRIP CLOSURE SKIN 1/2X4 (GAUZE/BANDAGES/DRESSINGS) ×2 IMPLANT
SUT VIC AB 0 CT1 18XCR BRD8 (SUTURE) ×2 IMPLANT
SUT VIC AB 2-0 CT1 18 (SUTURE) ×1 IMPLANT
SUT VIC AB 4-0 PS2 27 (SUTURE) ×1 IMPLANT
SYR 20ML LL LF (SYRINGE) IMPLANT
SYR 30ML SLIP (SYRINGE) ×1 IMPLANT
TOWEL GREEN STERILE (TOWEL DISPOSABLE) ×1 IMPLANT
TOWEL GREEN STERILE FF (TOWEL DISPOSABLE) ×1 IMPLANT
TRAY FOLEY MTR SLVR 16FR STAT (SET/KITS/TRAYS/PACK) ×1 IMPLANT
WATER STERILE IRR 1000ML POUR (IV SOLUTION) ×1 IMPLANT

## 2024-02-28 NOTE — Anesthesia Postprocedure Evaluation (Signed)
 Anesthesia Post Note  Patient: Kaily P Preuss  Procedure(s) Performed: POSTERIOR LUMBAR HARDWARE REMOVAL (Back)     Patient location during evaluation: PACU Anesthesia Type: General Level of consciousness: awake and alert Pain management: pain level controlled Vital Signs Assessment: post-procedure vital signs reviewed and stable Respiratory status: spontaneous breathing, nonlabored ventilation, respiratory function stable and patient connected to nasal cannula oxygen Cardiovascular status: blood pressure returned to baseline and stable Postop Assessment: no apparent nausea or vomiting Anesthetic complications: no  No notable events documented.  Last Vitals:  Vitals:   02/28/24 1330 02/28/24 1351  BP:  (!) 146/86  Pulse: 85 87  Resp: 20 18  Temp:  36.6 C  SpO2: 95% 98%    Last Pain:  Vitals:   02/28/24 1351  TempSrc: Oral  PainSc:                  Leyan Branden E

## 2024-02-28 NOTE — H&P (Signed)
 Tammy Hicks is an 78 y.o. female.   Chief Complaint: Back pain HPI: 78 year old female previously undergone thoracolumbar fusion presents with progressive worsening back pain at the top part of her incision workup revealed loosening of her top screws but solid posterolateral fusion below that so we have recommended expiration fusion cutting the rod removal loose screws may be laying down some additional posterior lateral bone in situ to augment the fusion superiorly but appears that she has got solid fusion I do not feel and nor did I recommend placement of any additional hardware.  I have extensively gone over the risks and benefits of the operation with her as well as perioperative course expectations of outcome and alternatives to surgery and she understands and agrees to proceed forward.  Past Medical History:  Diagnosis Date   Anxiety    Chronic kidney disease    Stage 3 CKD   Depression    Diabetes mellitus without complication (HCC)    Family history of adverse reaction to anesthesia    "my sister gets really sick afterwards"   GERD (gastroesophageal reflux disease)    Headache    migraines   History of kidney stones    Hyperlipidemia    Hypertension    Insomnia    Osteoarthritis    PONV (postoperative nausea and vomiting)    "progresses to migraine"   Restless leg syndrome    Sleep apnea    uses CPAP    Past Surgical History:  Procedure Laterality Date   ABDOMINAL EXPOSURE N/A 05/05/2021   Procedure: ABDOMINAL EXPOSURE;  Surgeon: Young Hensen, MD;  Location: [facility] OR;  Service: Vascular;  Laterality: N/A;   ABDOMINAL HYSTERECTOMY     ANTERIOR LAT LUMBAR FUSION Left 05/05/2021   Procedure: LUMBAR ONE-TWO ANTERIOR LATERAL LUMBAR INTERBODY FUSION;  Surgeon: Gearl Keens, MD;  Location: [facility] OR;  Service: Neurosurgery;  Laterality: Left;   ANTERIOR LUMBAR FUSION N/A 05/05/2021   Procedure: LUMBAR FIVE-SACRAL ONE ANTERIOR LUMBAR INTERBODY FUSION;  Surgeon: Gearl Keens, MD;   Location: [facility] OR;  Service: Neurosurgery;  Laterality: N/A;   APPLICATION OF INTRAOPERATIVE CT SCAN N/A 05/05/2021   Procedure: AIRO stereotactic navigation;  Surgeon: Gearl Keens, MD;  Location: [facility] OR;  Service: Neurosurgery;  Laterality: N/A;   BACK SURGERY     x2    BREAST SURGERY     Fibroid tumors removed   CESAREAN SECTION     COLONOSCOPY     x2   LAMINECTOMY WITH POSTERIOR LATERAL ARTHRODESIS LEVEL 3 N/A 05/31/2023   Procedure: Exploration and extension of fusion - Thoracic ten-Thoracic eleven- Thoracic eleven-Thoracic twelve - Thoracic twelve-Lumbar one;  Surgeon: Gearl Keens, MD;  Location: [facility] OR;  Service: Neurosurgery;  Laterality: N/A;   LITHOTRIPSY     "several"   LUMBAR LAMINECTOMY/DECOMPRESSION MICRODISCECTOMY  05/05/2021   Procedure: LEFT LUMBAR FIVE-SACRAL ONE MICRODISCECTOMY;  Surgeon: Gearl Keens, MD;  Location: [facility] OR;  Service: Neurosurgery;;   LUMBAR PERCUTANEOUS PEDICLE SCREW 1 LEVEL N/A 05/05/2021   Procedure: PLACEMENT OF LUMBAR SCREWS WITH PELVIC FIXATION USING ILIAC SCREWS;  Surgeon: Gearl Keens, MD;  Location: [facility] OR;  Service: Neurosurgery;  Laterality: N/A;   TONSILLECTOMY     TOTAL KNEE ARTHROPLASTY Left 12/02/2022    History reviewed. No pertinent family history. Social History:  reports that she has never smoked. She has never used smokeless tobacco. She reports that she does not drink alcohol  and does not use drugs.  Allergies:  Allergies  Allergen Reactions   Pioglitazone  Swelling and Other (See Comments)    Leg swelling Fatigue    Primidone Palpitations, Shortness Of Breath and Other (See Comments)    DYSPNEA   Flexeril  [Cyclobenzaprine ] Other (See Comments)    Confusion   Codeine Nausea And Vomiting    Severe nausea     Medications Prior to Admission  Medication Sig Dispense Refill   acetaminophen  (TYLENOL ) 500 MG tablet Take 1,000-1,500 mg by mouth every 4 (four) hours as needed for moderate pain or headache.     BELSOMRA  15 MG TABS Take 15 mg  by mouth at bedtime as needed (sleep).     busPIRone  (BUSPAR ) 10 MG tablet Take 10 mg by mouth 3 (three) times daily.     desvenlafaxine (PRISTIQ) 100 MG 24 hr tablet Take 100 mg by mouth daily.     diclofenac Sodium (VOLTAREN) 1 % GEL Apply 2 g topically daily as needed (pain).     fluticasone  (FLONASE ) 50 MCG/ACT nasal spray Place 1 spray into both nostrils daily as needed for allergies.     furosemide  (LASIX ) 40 MG tablet Take 40 mg by mouth daily.     gabapentin  (NEURONTIN ) 300 MG capsule Take 300-600 mg by mouth See admin instructions. Take 300 mg by mouth in the morning and afternoon and take 600 mg at bedtime     hydrochlorothiazide  (HYDRODIURIL ) 25 MG tablet Take 25 mg by mouth daily.     insulin  regular (HUMULIN R ) 100 units/mL injection Inject 10-40 Units into the skin 4 (four) times daily as needed for high blood sugar.     meclizine  (ANTIVERT ) 25 MG tablet Take 25 mg by mouth every 6 (six) hours as needed for dizziness.     metFORMIN  (GLUCOPHAGE -XR) 500 MG 24 hr tablet Take 500 mg by mouth in the morning and at bedtime.     olmesartan  (BENICAR ) 20 MG tablet Take 20 mg by mouth daily.     omeprazole (PRILOSEC) 20 MG capsule Take 20 mg by mouth daily.     oxyCODONE -acetaminophen  (PERCOCET) 10-325 MG tablet Take 1 tablet by mouth every 6 (six) hours as needed for pain.     potassium chloride  SA (KLOR-CON ) 20 MEQ tablet Take 20 mEq by mouth 2 (two) times daily.     pramipexole  (MIRAPEX ) 1.5 MG tablet Take 1.5 mg by mouth at bedtime.     pravastatin  (PRAVACHOL ) 10 MG tablet Take 10 mg by mouth daily.     Blood Glucose Monitoring Suppl (ONE TOUCH ULTRA 2) w/Device KIT Use as instructed. Check blood sugar 2 times per day. Dx. E11.9     butalbital -acetaminophen -caffeine  (FIORICET ) 50-325-40 MG tablet Take 1 tablet by mouth every 6 (six) hours as needed for headache.     Insulin  Syringe-Needle U-100 (B-D INS SYR ULTRAFINE 1CC/30G) 30G X 1/2" 1 ML MISC See admin instructions.     Insulin   Syringe-Needle U-100 25G X 1" 1 ML MISC Use 3 times a day as needed     NON FORMULARY Pt uses a cpap nightly     OneTouch Delica Lancets 30G MISC 1 each by Other route 2 times daily. Dx E11.9     tiZANidine  (ZANAFLEX ) 2 MG tablet Take 1 tablet (2 mg total) by mouth 3 (three) times daily. (Patient not taking: Reported on 05/17/2023) 45 tablet 0    Results for orders placed or performed during the hospital encounter of 02/28/24 (from the past 48 hours)  Glucose, capillary     Status: None   Collection Time: 02/28/24  8:36 AM  Result Value Ref Range   Glucose-Capillary 95 70 - 99 mg/dL    Comment: Glucose reference range applies only to samples taken after fasting for at least 8 hours.   Comment 1 Notify RN    No results found.  Review of Systems  Musculoskeletal:  Positive for back pain.    Blood pressure (!) 136/90, pulse 84, temperature 98.2 F (36.8 C), temperature source Oral, resp. rate 18, height 5\' 3"  (1.6 m), weight 73 kg, SpO2 95%. Physical Exam HENT:     Head: Normocephalic.     Right Ear: Tympanic membrane normal.     Nose: Nose normal.     Mouth/Throat:     Mouth: Mucous membranes are moist.  Cardiovascular:     Rate and Rhythm: Normal rate.     Pulses: Normal pulses.  Pulmonary:     Effort: Pulmonary effort is normal.  Abdominal:     General: Abdomen is flat.  Musculoskeletal:        General: Normal range of motion.     Cervical back: Normal range of motion.  Neurological:     Mental Status: She is alert.     Comments: Strength 5-5 iliopsoas, quads, hamstrings, gastroc, tibialis, and EHL.      Assessment/Plan Patient presents for expiration fusion removal of hardware with removal of top loose screws.  Ferris Hua, MD 02/28/2024, 9:55 AM

## 2024-02-28 NOTE — Anesthesia Preprocedure Evaluation (Signed)
 Anesthesia Evaluation  Patient identified by MRN, date of birth, ID band Patient awake    Reviewed: Allergy & Precautions, NPO status , Patient's Chart, lab work & pertinent test results  History of Anesthesia Complications (+) PONV and history of anesthetic complications  Airway Mallampati: II  TM Distance: >3 FB Neck ROM: Full    Dental  (+) Dental Advisory Given   Pulmonary sleep apnea and Continuous Positive Airway Pressure Ventilation    breath sounds clear to auscultation       Cardiovascular hypertension, Pt. on medications  Rhythm:Regular Rate:Normal     Neuro/Psych  Headaches  Neuromuscular disease    GI/Hepatic Neg liver ROS,GERD  ,,  Endo/Other  diabetes, Type 2, Insulin  Dependent, Oral Hypoglycemic Agents    Renal/GU CRFRenal disease     Musculoskeletal  (+) Arthritis ,    Abdominal   Peds  Hematology   Anesthesia Other Findings   Reproductive/Obstetrics                             Anesthesia Physical Anesthesia Plan  ASA: 3  Anesthesia Plan: General   Post-op Pain Management: Ofirmev  IV (intra-op)*   Induction: Intravenous  PONV Risk Score and Plan: 4 or greater and Propofol  infusion, Dexamethasone , Ondansetron  and Treatment may vary due to age or medical condition  Airway Management Planned: Oral ETT  Additional Equipment:   Intra-op Plan:   Post-operative Plan: Extubation in OR  Informed Consent: I have reviewed the patients History and Physical, chart, labs and discussed the procedure including the risks, benefits and alternatives for the proposed anesthesia with the patient or authorized representative who has indicated his/her understanding and acceptance.     Dental advisory given  Plan Discussed with: CRNA  Anesthesia Plan Comments: (PAT note by Rudy Costain, PA-C:  78 year old female with pertinent history including IDDM 2 (A1c 6.9 on 01/27/2024),  OSA not using CPAP, GERD, stage III CKD, HTN, PONV, venous insufficiency/LE edema.  Chronic medical conditions followed by PCP Dr. Griffin Lecher at [facility].  Last seen 01/27/2024, no acute concerns at that time, upcoming surgery discussed.  Patient previously had evaluation for atypical chest pain.  Nuclear stress test 08/14/2022 with no evidence of infarct or ischemia.  Prior TTE 11/18/2018 showed EF 65%, no significant valvular abnormalities.  Preop labs reviewed, creatinine mildly elevated 1.30 consistent with history of CKD, mild chronic anemia with hemoglobin 10.1.  EKG 05/31/2023: Normal sinus rhythm.  Rate 84. Right bundle branch block  Nuclear stress 08/13/2022 (Care Everywhere): 1. Uneventful Lexiscan injection with no evidence of ischemia during  vasodilator stress.  2. Normal myocardial perfusion imaging with no evidence of infarct or  ischemia  3.Normal left-ventricular systolic function with a calculated ejection  fraction of 85%.   TTE 11/18/2021 (Care Everywhere): SUMMARY  Left ventricular systolic function is normal.  LV ejection fraction = 60-65%.  There is aortic valve sclerosis.  There is no aortic stenosis.  There is trace aortic regurgitation.  There is no comparison study available.   )        Anesthesia Quick Evaluation

## 2024-02-28 NOTE — Anesthesia Procedure Notes (Signed)
 Procedure Name: Intubation Date/Time: 02/28/2024 10:46 AM  Performed by: Artemisa Bile D, CRNAPre-anesthesia Checklist: Patient identified, Emergency Drugs available, Suction available and Patient being monitored Patient Re-evaluated:Patient Re-evaluated prior to induction Oxygen Delivery Method: Circle System Utilized Preoxygenation: Pre-oxygenation with 100% oxygen Induction Type: IV induction Ventilation: Mask ventilation without difficulty Laryngoscope Size: Mac and 3 Grade View: Grade I Tube type: Oral Tube size: 7.0 mm Number of attempts: 1 Airway Equipment and Method: Stylet and Oral airway Placement Confirmation: ETT inserted through vocal cords under direct vision, positive ETCO2 and breath sounds checked- equal and bilateral Secured at: 22 cm Tube secured with: Tape Dental Injury: Teeth and Oropharynx as per pre-operative assessment

## 2024-02-28 NOTE — Plan of Care (Signed)

## 2024-02-28 NOTE — Transfer of Care (Signed)
 Immediate Anesthesia Transfer of Care Note  Patient: Tammy Hicks  Procedure(s) Performed: POSTERIOR LUMBAR HARDWARE REMOVAL (Back)  Patient Location: PACU  Anesthesia Type:General  Level of Consciousness: awake, alert , and oriented  Airway & Oxygen Therapy: Patient Spontanous Breathing and Patient connected to nasal cannula oxygen  Post-op Assessment: Report given to RN and Post -op Vital signs reviewed and stable  Post vital signs: Reviewed and stable  Last Vitals:  Vitals Value Taken Time  BP 110/62 02/28/24 1200  Temp    Pulse 83 02/28/24 1201  Resp 26 02/28/24 1201  SpO2 95 % 02/28/24 1201  Vitals shown include unfiled device data.  Last Pain:  Vitals:   02/28/24 0850  TempSrc:   PainSc: 3       Patients Stated Pain Goal: 3 (02/28/24 0850)  Complications: No notable events documented.

## 2024-02-28 NOTE — Op Note (Signed)
 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  Postoperative diagnosis: Same.  Procedure: Exploration fusion removal hardware T10 with cutting the rod between T10-11 and removal of bilateral loose T10 screws.  2.  Posterolateral site to arthrodesis utilizing Vivigen by BMP and Magnifuse T10-11.  Surgeon: Gearl Keens.  Assistant: Beula Brunswick.  Anesthesia: General.  EBL: Minimal.  HPI: 78-year female progressive worsening upper back pain workup revealed loosening of her T10 screws and possible pseudoarthrosis at T10-11 due to the patient's progression of clinical syndrome imaging findings of a conservative treatment I recommended expiration fusion with removal of hardware removal of bilateral loose T10 screws and explore the fusion possible redo posterolateral arthrodesis T10-11.  I extensively reviewed the risks and benefits of the operation with patient as well as perioperative course expectations of outcome and alternatives of surgery and she understood and agreed to proceed forward.  Operative procedure: Patient was brought into the OR was Duson general anesthesia positioned prone the Wilson frame her back was prepped and draped in routine sterile fashion the superior aspect of her incision was infiltrated with 10 cc lidocaine  with epi and incised scar tissue was dissected free exposing the hardware from just below the T11 screw to the above the T10 screw and exposing the TPs facet joints and lateral spine at those levels.  I then cut the rod just inferior to the T10 screw removed each loose T10 screw and they are very loose and the remainder of the rod exposed the TPs and facet joints copiously irrigated the wound aggressively decorticated the TPs lateral pars and lamina and laid some additional bone graft with BMP Vivigen and Magnifuse posterior laterally T10-11.  The wounds were copiously irrigated 6 hemostasis was maintained the wounds then closed in layers with Vicryl  and a running 4 subcuticular.  Dermabond benzoin Steri-Strips and a sterile dressing was applied patient to cover him in stable condition.  At the end the case all needle count sponge counts were correct.

## 2024-02-29 LAB — HEMOGLOBIN A1C
Hgb A1c MFr Bld: 7.2 % — ABNORMAL HIGH (ref 4.8–5.6)
Mean Plasma Glucose: 160 mg/dL

## 2024-02-29 LAB — GLUCOSE, CAPILLARY: Glucose-Capillary: 111 mg/dL — ABNORMAL HIGH (ref 70–99)

## 2024-02-29 MED ORDER — GABAPENTIN 100 MG PO CAPS
100.0000 mg | ORAL_CAPSULE | Freq: Two times a day (BID) | ORAL | Status: DC
  Administered 2024-02-29: 100 mg via ORAL
  Filled 2024-02-29: qty 1

## 2024-02-29 MED ORDER — OXYCODONE-ACETAMINOPHEN 10-325 MG PO TABS: 1.0000 | ORAL_TABLET | Freq: Four times a day (QID) | ORAL | 0 refills | Status: AC | PRN

## 2024-02-29 MED ORDER — TIZANIDINE HCL 2 MG PO TABS: 2.0000 mg | ORAL_TABLET | Freq: Three times a day (TID) | ORAL | 0 refills | Status: AC

## 2024-02-29 MED ORDER — INSULIN ASPART 100 UNIT/ML IJ SOLN: 0.0000 [IU] | Freq: Three times a day (TID) | INTRAMUSCULAR | Status: DC

## 2024-02-29 MED ORDER — VENLAFAXINE HCL ER 37.5 MG PO CP24
37.5000 mg | ORAL_CAPSULE | Freq: Every day | ORAL | Status: DC
  Administered 2024-02-29: 37.5 mg via ORAL
  Filled 2024-02-29: qty 1

## 2024-02-29 MED ORDER — METFORMIN HCL ER 500 MG PO TB24: 500.0000 mg | ORAL_TABLET | Freq: Two times a day (BID) | ORAL | Status: DC

## 2024-02-29 MED FILL — Thrombin For Soln 5000 Unit: CUTANEOUS | Qty: 2 | Status: AC

## 2024-02-29 NOTE — Progress Notes (Signed)
 Patient alert and oriented, mae's well, voiding adequate amount of urine, swallowing without difficulty, no c/o pain at time of discharge. Patient discharged home with family. Script and discharged instructions given to patient. Patient and family stated understanding of instructions given. Room was checked and accounted for all patient's belongings; discharge instructions concerning his medications, incision care, follow up appointment and when to call the doctor as needed were all discussed with patient by RN and she expressed understanding on the instructions given

## 2024-02-29 NOTE — Evaluation (Signed)
 Occupational Therapy Evaluation and Discharge Patient Details Name: Tammy Hicks MRN: [id number] DOB: 20-Mar-1946 Today's Date: 02/29/2024   History of Present Illness   [name] s/p exploration fusion removal hardware T10 with cutting the rod between T10-11 and removal of bilateral loose T10 screws and redo posterior lateral fusion. PHMx: anxiety, CKD III, depression, migraines, L TKA, HTN, OA, restless leg syndrome, back sx x3.     Clinical Impressions [name] admitted and underwent above presents to acute OT with all education completed and post op back handout provided and reviewed. No further OT needs, we will sign off.     If plan is discharge home, recommend the following:   Assistance with cooking/housework;Assist for transportation;Help with stairs or ramp for entrance     Functional Status Assessment   Patient has had a recent decline in their functional status and demonstrates the ability to make significant improvements in function in a reasonable and predictable amount of time. (no further OT needs, no skilled PT needs identified)     Equipment Recommendations   None recommended by OT      Precautions/Restrictions   Precautions Precautions: Back Precaution Booklet Issued: Yes (comment) Recall of Precautions/Restrictions: Intact Required Braces or Orthoses: Spinal Brace Spinal Brace: Thoracolumbosacral orthotic;Applied in sitting position;Applied in standing position Restrictions Weight Bearing Restrictions Per Provider Order: No     Mobility Bed Mobility Overal bed mobility: Modified Independent                  Transfers Overall transfer level: Modified independent Equipment used: Rolling walker (2 wheels), Straight cane               General transfer comment: Pt up and down 2 steps with SPC and 1 person HHA (step to pattern)      Balance Overall balance assessment: Mild deficits observed, not formally tested                                          ADL either performed or assessed with clinical judgement   ADL Overall ADL's : Modified independent                                       General ADL Comments: Educated on use of 2 cups for brushing teeth, use of wet wipes for back peri care, use of pillows for positioning in bed, building up sitting tolerance, sequence of dressing, in and OOB. Pt  reports she feels more comfortable with RW than SPC and will get hers back from a family member she lent it to.     Vision Patient Visual Report: No change from baseline              Pertinent Vitals/Pain Pain Assessment Pain Assessment: 0-10 Pain Score: 5  Pain Location: incisional Pain Descriptors / Indicators: Sore Pain Intervention(s): Limited activity within patient's tolerance, Monitored during session, Repositioned     Extremity/Trunk Assessment Upper Extremity Assessment Upper Extremity Assessment: Overall WFL for tasks assessed           Communication Communication Communication: No apparent difficulties   Cognition Arousal: Alert Behavior During Therapy: WFL for tasks assessed/performed Cognition: No apparent impairments  Following commands: Intact       Cueing    Cueing Techniques: Verbal cues              Home Living Family/patient expects to be discharged to:: Private residence Living Arrangements: Spouse/significant other Available Help at Discharge: Family;Available 24 hours/day Type of Home: House Home Access: Stairs to enter [employer] of Steps: 2 Entrance Stairs-Rails: None Home Layout: One level     Bathroom Shower/Tub: Producer, television/film/video: Handicapped height     Home Equipment: Agricultural consultant (2 wheels);Cane - single point          Prior Functioning/Environment Prior Level of Function : Independent/Modified Independent              Mobility Comments: MOd I with SPC intermittently ADLs Comments: reports indep ADLs    OT Problem List: Decreased range of motion;Impaired balance (sitting and/or standing);Pain        OT Goals(Current goals can be found in the care plan section)   Acute Rehab OT Goals Patient Stated Goal: to go home today         AM-PAC OT "6 Clicks" Daily Activity     Outcome Measure Help from another person eating meals?: None Help from another person taking care of personal grooming?: None Help from another person toileting, which includes using toliet, bedpan, or urinal?: None Help from another person bathing (including washing, rinsing, drying)?: None Help from another person to put on and taking off regular upper body clothing?: None Help from another person to put on and taking off regular lower body clothing?: None 6 Click Score: 24   End of Session Equipment Utilized During Treatment: Gait belt;Rolling walker (2 wheels) (SPC) Nurse Communication:  (no further OT needs)  Activity Tolerance: Patient tolerated treatment well Patient left: in chair;with call bell/phone within reach  OT Visit Diagnosis: Unsteadiness on feet (R26.81);Pain Pain - part of body:  (incisional)                Time: 1610-9604 OT Time Calculation (min): 45 min Charges:  OT General Charges $OT Visit: 1 Visit OT Evaluation $OT Eval Moderate Complexity: 1 Mod OT Treatments $Self Care/Home Management : 23-37 mins  Merryl Abraham OT Acute Rehabilitation Services Office [phone]    Lenox Raider 02/29/2024, 9:29 AM

## 2024-02-29 NOTE — Plan of Care (Signed)
  Problem: Education: Goal: Knowledge of General Education information will improve Description: Including pain rating scale, medication(s)/side effects and non-pharmacologic comfort measures Outcome: Completed/Met   Problem: Health Behavior/Discharge Planning: Goal: Ability to manage health-related needs will improve Outcome: Completed/Met   Problem: Clinical Measurements: Goal: Ability to maintain clinical measurements within normal limits will improve Outcome: Completed/Met Goal: Will remain free from infection Outcome: Completed/Met Goal: Diagnostic test results will improve Outcome: Completed/Met Goal: Respiratory complications will improve Outcome: Completed/Met Goal: Cardiovascular complication will be avoided Outcome: Completed/Met   Problem: Activity: Goal: Risk for activity intolerance will decrease Outcome: Completed/Met   Problem: Nutrition: Goal: Adequate nutrition will be maintained Outcome: Completed/Met   Problem: Coping: Goal: Level of anxiety will decrease Outcome: Completed/Met   Problem: Elimination: Goal: Will not experience complications related to bowel motility Outcome: Completed/Met Goal: Will not experience complications related to urinary retention Outcome: Completed/Met   Problem: Pain Managment: Goal: General experience of comfort will improve and/or be controlled Outcome: Completed/Met   Problem: Safety: Goal: Ability to remain free from injury will improve Outcome: Completed/Met   Problem: Skin Integrity: Goal: Risk for impaired skin integrity will decrease Outcome: Completed/Met   Problem: Education: Goal: Ability to describe self-care measures that may prevent or decrease complications (Diabetes Survival Skills Education) will improve Outcome: Completed/Met Goal: Individualized Educational Video(s) Outcome: Completed/Met   Problem: Coping: Goal: Ability to adjust to condition or change in health will improve Outcome:  Completed/Met   Problem: Fluid Volume: Goal: Ability to maintain a balanced intake and output will improve Outcome: Completed/Met   Problem: Health Behavior/Discharge Planning: Goal: Ability to identify and utilize available resources and services will improve Outcome: Completed/Met Goal: Ability to manage health-related needs will improve Outcome: Completed/Met   Problem: Metabolic: Goal: Ability to maintain appropriate glucose levels will improve Outcome: Completed/Met   Problem: Nutritional: Goal: Maintenance of adequate nutrition will improve Outcome: Completed/Met Goal: Progress toward achieving an optimal weight will improve Outcome: Completed/Met   Problem: Skin Integrity: Goal: Risk for impaired skin integrity will decrease Outcome: Completed/Met   Problem: Tissue Perfusion: Goal: Adequacy of tissue perfusion will improve Outcome: Completed/Met   Problem: Education: Goal: Ability to verbalize activity precautions or restrictions will improve Outcome: Completed/Met Goal: Knowledge of the prescribed therapeutic regimen will improve Outcome: Completed/Met Goal: Understanding of discharge needs will improve Outcome: Completed/Met   Problem: Activity: Goal: Ability to avoid complications of mobility impairment will improve Outcome: Completed/Met Goal: Ability to tolerate increased activity will improve Outcome: Completed/Met Goal: Will remain free from falls Outcome: Completed/Met   Problem: Bowel/Gastric: Goal: Gastrointestinal status for postoperative course will improve Outcome: Completed/Met   Problem: Clinical Measurements: Goal: Ability to maintain clinical measurements within normal limits will improve Outcome: Completed/Met Goal: Postoperative complications will be avoided or minimized Outcome: Completed/Met Goal: Diagnostic test results will improve Outcome: Completed/Met   Problem: Pain Management: Goal: Pain level will decrease Outcome:  Completed/Met   Problem: Skin Integrity: Goal: Will show signs of wound healing Outcome: Completed/Met   Problem: Health Behavior/Discharge Planning: Goal: Identification of resources available to assist in meeting health care needs will improve Outcome: Completed/Met   Problem: Bladder/Genitourinary: Goal: Urinary functional status for postoperative course will improve Outcome: Completed/Met

## 2024-02-29 NOTE — Discharge Summary (Signed)
 Physician Discharge Summary  Patient ID: Tammy Hicks MRN: [id number] DOB/AGE: [birth year] 78 y.o.  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     Discharge Diagnoses: same   Discharged Condition: good  Hospital Course: The patient was admitted on 02/28/2024 and taken to the operating room where the patient underwent screw removal T10 and redo posterior lateral fusion. The patient tolerated the procedure well and was taken to the recovery room and then to the floor in stable condition. The hospital course was routine. There were no complications. The wound remained clean dry and intact. Pt had appropriate back soreness. No complaints of leg pain or new N/T/W. The patient remained afebrile with stable vital signs, and tolerated a regular diet. The patient continued to increase activities, and pain was well controlled with oral pain medications.   Consults: None  Significant Diagnostic Studies:  Results for orders placed or performed during the hospital encounter of 02/28/24  Glucose, capillary   Collection Time: 02/28/24  8:36 AM  Result Value Ref Range   Glucose-Capillary 95 70 - 99 mg/dL   Comment 1 Notify RN   Type and screen [facility]   Collection Time: 02/28/24  9:28 AM  Result Value Ref Range   ABO/RH(D) A POS    Antibody Screen NEG    Sample Expiration      03/02/2024,2359 Performed at [facility], [address] [place], [place] [address]   Glucose, capillary   Collection Time: 02/28/24 10:34 AM  Result Value Ref Range   Glucose-Capillary 108 (H) 70 - 99 mg/dL  Glucose, capillary   Collection Time: 02/28/24 12:01 PM  Result Value Ref Range   Glucose-Capillary 126 (H) 70 - 99 mg/dL  Glucose, capillary   Collection Time: 02/28/24  4:35 PM  Result Value Ref Range   Glucose-Capillary 266 (H) 70 - 99 mg/dL  Glucose, capillary   Collection Time: 02/28/24  9:33 PM  Result  Value Ref Range   Glucose-Capillary 167 (H) 70 - 99 mg/dL   Comment 1 Notify RN    Comment 2 Document in Chart   Glucose, capillary   Collection Time: 02/29/24  6:32 AM  Result Value Ref Range   Glucose-Capillary 111 (H) 70 - 99 mg/dL   Comment 1 Notify RN    Comment 2 Document in Chart     No results found.  Antibiotics:  Anti-infectives (From admission, onward)    Start     Dose/Rate Route Frequency Ordered Stop   02/28/24 1500  ceFAZolin  (ANCEF ) IVPB 2g/100 mL premix        2 g 200 mL/hr over 30 Minutes Intravenous Every 8 hours 02/28/24 1333 02/29/24 0030   02/28/24 0835  ceFAZolin  (ANCEF ) 2-4 GM/100ML-% IVPB       Note to Pharmacy: Lana Pina T: cabinet override      02/28/24 0835 02/28/24 1101   02/28/24 0830  ceFAZolin  (ANCEF ) IVPB 2g/100 mL premix        2 g 200 mL/hr over 30 Minutes Intravenous On call to O.R. 02/28/24 0827 02/28/24 1056       Discharge Exam: Blood pressure 131/74, pulse 87, temperature 98.1 F (36.7 C), temperature source Oral, resp. rate 18, height 5\' 3"  (1.6 m), weight 73 kg, SpO2 97%. Neurologic: Grossly normal Ambulating and voiding well, incision cdi   Discharge Medications:   Allergies as of 02/29/2024       Reactions   Pioglitazone  Swelling, Other (See Comments)   Leg swelling Fatigue   Primidone Palpitations, Shortness Of Breath, Other (See Comments)   DYSPNEA   Flexeril  [cyclobenzaprine ] Other (See Comments)   Confusion   Codeine Nausea And Vomiting   Severe nausea         Medication List     TAKE these medications    acetaminophen  500 MG tablet Commonly known as: TYLENOL  Take 1,000-1,500 mg by mouth every 4 (four) hours as needed for moderate pain or headache.   Belsomra  15 MG Tabs Generic drug: Suvorexant  Take 15 mg by mouth at bedtime as needed (sleep).   busPIRone  10 MG tablet Commonly known as: BUSPAR  Take 10 mg by mouth 3 (three) times daily.   butalbital -acetaminophen -caffeine  50-325-40 MG  tablet Commonly known as: FIORICET  Take 1 tablet by mouth every 6 (six) hours as needed for headache.   desvenlafaxine 100 MG 24 hr tablet Commonly known as: PRISTIQ Take 100 mg by mouth daily.   fluticasone  50 MCG/ACT nasal spray Commonly known as: FLONASE  Place 1 spray into both nostrils daily as needed for allergies.   furosemide  40 MG tablet Commonly known as: LASIX  Take 40 mg by mouth daily.   gabapentin  300 MG capsule Commonly known as: NEURONTIN  Take 300-600 mg by mouth See admin instructions. Take 300 mg by mouth in the morning and afternoon and take 600 mg at bedtime What changed:  how much to take additional instructions   HumuLIN R  100 units/mL injection Generic drug: insulin  regular Inject 10-40 Units into the skin 4 (four) times daily as needed for high blood sugar.   hydrochlorothiazide  25 MG tablet Commonly known as: HYDRODIURIL  Take 25 mg by mouth daily.   Insulin  Syringe-Needle U-100 25G X 1" 1 ML Misc Use 3 times a day as needed   B-D INS SYR ULTRAFINE 1CC/30G 30G X 1/2" 1 ML Misc Generic drug: Insulin  Syringe-Needle U-100 See admin instructions.   meclizine  25 MG tablet Commonly known as: ANTIVERT  Take 25 mg by mouth every 6 (six) hours as needed for dizziness.   metFORMIN  500 MG 24 hr tablet Commonly known as: GLUCOPHAGE -XR Take 500 mg by mouth in the morning and at bedtime.   NON FORMULARY Pt uses a cpap nightly   olmesartan  20 MG tablet Commonly known as: BENICAR  Take 20 mg by mouth daily.   omeprazole 20 MG capsule Commonly known as: PRILOSEC Take 20 mg by mouth daily.   ONE TOUCH ULTRA 2 w/Device Kit Use as instructed. Check blood sugar 2 times per day. Dx. E11.9   OneTouch Delica Lancets 30G Misc 1 each by Other route 2 times daily. Dx E11.9   oxyCODONE -acetaminophen  10-325 MG tablet Commonly known as: PERCOCET Take 1 tablet by mouth every 6 (six) hours as needed for pain.   potassium chloride  SA 20 MEQ tablet Commonly known  as: KLOR-CON  M Take 20 mEq by mouth 2 (two) times daily.   pramipexole  1.5 MG tablet Commonly known as: MIRAPEX  Take 1.5 mg by mouth at bedtime.   pravastatin  10 MG tablet Commonly known as: PRAVACHOL  Take 10 mg by mouth daily.   tiZANidine  2 MG tablet Commonly known as: ZANAFLEX  Take 1 tablet (2 mg total) by mouth 3 (three) times daily.   Voltaren 1 % Gel Generic drug: diclofenac Sodium Apply 2 g topically daily as needed (pain).        Disposition: home   Final Dx: removal of T10 screws and redo posterior lateral fusion T10-T11  Discharge Instructions  Remove dressing in 72 hours   Complete by: As directed    Call MD for:  difficulty breathing, headache or visual disturbances   Complete by: As directed    Call MD for:  hives   Complete by: As directed    Call MD for:  persistant dizziness or light-headedness   Complete by: As directed    Call MD for:  persistant nausea and vomiting   Complete by: As directed    Call MD for:  redness, tenderness, or signs of infection (pain, swelling, redness, odor or green/yellow discharge around incision site)   Complete by: As directed    Call MD for:  severe uncontrolled pain   Complete by: As directed    Call MD for:  temperature >100.4   Complete by: As directed    Diet - low sodium heart healthy   Complete by: As directed    Driving Restrictions   Complete by: As directed    No driving for 2 weeks, no riding in the car for 1 week   Increase activity slowly   Complete by: As directed    Lifting restrictions   Complete by: As directed    No lifting more than 8 lbs          Signed: Kenard Paul Tayva Easterday 02/29/2024, 7:40 AM
